# Patient Record
Sex: Female | Born: 2000 | Race: Black or African American | Hispanic: No | Marital: Single | Smoking: Current every day smoker
Health system: Southern US, Community
[De-identification: ages and names within clinical notes are randomized; demographics above are authoritative.]

## PROBLEM LIST (undated history)

## (undated) DIAGNOSIS — T7840XA Allergy, unspecified, initial encounter: Secondary | ICD-10-CM

## (undated) DIAGNOSIS — R51 Headache: Secondary | ICD-10-CM

## (undated) DIAGNOSIS — R519 Headache, unspecified: Secondary | ICD-10-CM

## (undated) DIAGNOSIS — J309 Allergic rhinitis, unspecified: Secondary | ICD-10-CM

## (undated) HISTORY — DX: Allergic rhinitis, unspecified: J30.9

## (undated) HISTORY — DX: Headache: R51

## (undated) HISTORY — DX: Headache, unspecified: R51.9

## (undated) HISTORY — PX: NO PAST SURGERIES: SHX2092

---
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
## 2019-05-11 DIAGNOSIS — Z5181 Encounter for therapeutic drug level monitoring: Secondary | ICD-10-CM | POA: Diagnosis not present

## 2019-05-18 DIAGNOSIS — Z5181 Encounter for therapeutic drug level monitoring: Secondary | ICD-10-CM | POA: Diagnosis not present

## 2019-05-25 DIAGNOSIS — Z5181 Encounter for therapeutic drug level monitoring: Secondary | ICD-10-CM | POA: Diagnosis not present

## 2019-06-01 DIAGNOSIS — Z5181 Encounter for therapeutic drug level monitoring: Secondary | ICD-10-CM | POA: Diagnosis not present

## 2019-06-08 DIAGNOSIS — Z5181 Encounter for therapeutic drug level monitoring: Secondary | ICD-10-CM | POA: Diagnosis not present

## 2019-06-15 DIAGNOSIS — Z5181 Encounter for therapeutic drug level monitoring: Secondary | ICD-10-CM | POA: Diagnosis not present

## 2019-06-23 DIAGNOSIS — Z5181 Encounter for therapeutic drug level monitoring: Secondary | ICD-10-CM | POA: Diagnosis not present

## 2019-07-12 DIAGNOSIS — H5203 Hypermetropia, bilateral: Secondary | ICD-10-CM | POA: Diagnosis not present

## 2019-07-12 DIAGNOSIS — H52203 Unspecified astigmatism, bilateral: Secondary | ICD-10-CM | POA: Diagnosis not present

## 2019-07-13 DIAGNOSIS — Z5181 Encounter for therapeutic drug level monitoring: Secondary | ICD-10-CM | POA: Diagnosis not present

## 2019-07-14 DIAGNOSIS — H5213 Myopia, bilateral: Secondary | ICD-10-CM | POA: Diagnosis not present

## 2019-07-20 DIAGNOSIS — Z5181 Encounter for therapeutic drug level monitoring: Secondary | ICD-10-CM | POA: Diagnosis not present

## 2019-07-28 DIAGNOSIS — R319 Hematuria, unspecified: Secondary | ICD-10-CM | POA: Diagnosis not present

## 2019-07-28 DIAGNOSIS — R140 Abdominal distension (gaseous): Secondary | ICD-10-CM | POA: Diagnosis not present

## 2019-07-28 DIAGNOSIS — R109 Unspecified abdominal pain: Secondary | ICD-10-CM | POA: Diagnosis not present

## 2019-07-28 DIAGNOSIS — Z7689 Persons encountering health services in other specified circumstances: Secondary | ICD-10-CM | POA: Diagnosis not present

## 2019-08-18 DIAGNOSIS — Z5181 Encounter for therapeutic drug level monitoring: Secondary | ICD-10-CM | POA: Diagnosis not present

## 2019-09-01 DIAGNOSIS — Z5181 Encounter for therapeutic drug level monitoring: Secondary | ICD-10-CM | POA: Diagnosis not present

## 2019-09-12 ENCOUNTER — Other Ambulatory Visit: Payer: Self-pay

## 2019-09-12 ENCOUNTER — Emergency Department (HOSPITAL_COMMUNITY)
Admission: EM | Admit: 2019-09-12 | Discharge: 2019-09-12 | Disposition: A | Discharge status: Home / Self Care | Payer: Medicaid Other | Attending: Emergency Medicine | Admitting: Emergency Medicine

## 2019-09-12 ENCOUNTER — Encounter (HOSPITAL_COMMUNITY): Payer: Self-pay | Admitting: *Deleted

## 2019-09-12 DIAGNOSIS — R0981 Nasal congestion: Secondary | ICD-10-CM | POA: Diagnosis not present

## 2019-09-12 DIAGNOSIS — R197 Diarrhea, unspecified: Secondary | ICD-10-CM | POA: Diagnosis not present

## 2019-09-12 DIAGNOSIS — R519 Headache, unspecified: Secondary | ICD-10-CM | POA: Insufficient documentation

## 2019-09-12 DIAGNOSIS — R05 Cough: Secondary | ICD-10-CM | POA: Diagnosis present

## 2019-09-12 DIAGNOSIS — B349 Viral infection, unspecified: Secondary | ICD-10-CM | POA: Insufficient documentation

## 2019-09-12 DIAGNOSIS — Z20822 Contact with and (suspected) exposure to covid-19: Secondary | ICD-10-CM | POA: Diagnosis not present

## 2019-09-12 LAB — SARS CORONAVIRUS 2 (TAT 6-24 HRS): SARS Coronavirus 2: NEGATIVE

## 2019-09-12 MED ORDER — BENZONATATE 100 MG PO CAPS: 100.0000 mg | ORAL_CAPSULE | Freq: Three times a day (TID) | ORAL | 0 refills | Status: DC

## 2019-09-12 MED ORDER — FLUTICASONE PROPIONATE 50 MCG/ACT NA SUSP: 1.0000 | Freq: Every day | NASAL | 0 refills | Status: DC

## 2019-09-12 NOTE — ED Provider Notes (Signed)
[facility]-EMERGENCY DEPT Provider Note   CSN: [id number] Arrival date & time: 09/12/19  1105     History No chief complaint on file.   Patricia [name] is a 19 y.o. female presenting for evaluation of HA, cough, nasal congestion, and diarrhea.   Pt states her sxs began 4-5 days ago. She reports a HA began first, and yesterday she developed significant nasal congestion, cough, and diarrhea.  She states she feels tired.  She reports her cough is nonproductive.  She denies fevers, chills, chest pain, shortness breath, nausea, vomiting, domino pain.  She has no medical problems, takes medications daily.  She does not smoke cigarettes.  No history of asthma or COPD.  She denies sick contacts or known contact with COVID-19 positive person.  She has not received her coronavirus vaccines.  She took NyQuil yesterday, but otherwise has not taken anything for her symptoms including tylenol or ibuprofen.  HPI     Past Medical History:  Diagnosis Date  . Allergic   . Allergic rhinitis   . Generalized headaches     Patient Active Problem List   Diagnosis Date Noted  . Chlamydia infection 12/17/2018  . Bacterial vaginosis 12/17/2018  . Possible exposure to STD 06/03/2017  . Contraception management 04/07/2016  . Episodic tension-type headache, not intractable 02/24/2015  . Migraine without aura and without status migrainosus, not intractable 09/19/2014  . Migraine 09/05/2014  . Depression 10/15/2012  . ACNE VULGARIS 12/07/2009  . Seasonal allergies 11/28/2006    Past Surgical History:  Procedure Laterality Date  . TONSILLECTOMY AND ADENOIDECTOMY  2004     OB History   No obstetric history on file.     Family History  Problem Relation Age of Onset  . Migraines Mother   . Migraines Maternal Grandmother   . Migraines Maternal Aunt     Social History   Tobacco Use  . Smoking status: Passive Smoke Exposure - Never Smoker  . Smokeless tobacco: Never Used  .  Tobacco comment: Mom smokes outside only, pt lives alone currently (09/12/19)  Substance Use Topics  . Alcohol use: No    Alcohol/week: 0.0 standard drinks  . Drug use: No    Home Medications Prior to Admission medications   Medication Sig Start Date End Date Taking? Authorizing Provider  benzonatate (TESSALON) 100 MG capsule Take 1 capsule (100 mg total) by mouth every 8 (eight) hours. 09/12/19   Hadja Harral, PA-C  ciprofloxacin (CIPRO) 250 MG tablet Take 1 tablet (250 mg total) by mouth 2 (two) times daily. 10/30/17   Casey Burkitt, MD  dicyclomine (BENTYL) 20 MG tablet Take 1 tablet (20 mg total) by mouth 2 (two) times daily as needed for spasms. 12/04/18   Merita Hawks, PA-C  EPINEPHrine (EPIPEN 2-PAK) 0.3 mg/0.3 mL IJ SOAJ injection Inject 0.3 mLs (0.3 mg total) into the muscle once as needed. Patient taking differently: Inject 0.3 mg into the muscle once as needed (allergic reaction).  07/08/16   Latrelle Dodrill, MD  fluticasone (FLONASE) 50 MCG/ACT nasal spray Place 1 spray into both nostrils daily. 09/12/19   Tyshea Imel, PA-C  metroNIDAZOLE (FLAGYL) 500 MG tablet Take 1 tablet (500 mg total) by mouth 2 (two) times daily. 12/04/18   Brieana Shimmin, PA-C  ondansetron (ZOFRAN-ODT) 4 MG disintegrating tablet Take 1 tablet (4 mg total) by mouth every 8 (eight) hours as needed for nausea or vomiting. 01/28/18   Mardella Layman, MD    Allergies    Pineapple  and Penicillins  Review of Systems   Review of Systems  HENT: Positive for congestion.   Respiratory: Positive for cough.   Gastrointestinal: Positive for diarrhea.  Neurological: Positive for headaches.  All other systems reviewed and are negative.   Physical Exam Updated Vital Signs BP 113/72 (BP Location: Left Arm)   Pulse 88   Temp 98.9 F (37.2 C) (Oral)   Resp 18   Ht 5\' 4"  (1.626 m)   Wt 54.9 kg   SpO2 100%   BMI 20.77 kg/m   Physical Exam Vitals and nursing note reviewed.    Constitutional:      General: She is not in acute distress.    Appearance: She is well-developed.     Comments: Appears nontoxic  HENT:     Head: Normocephalic and atraumatic.     Comments: Patient likely from edema.  OP clear without tonsillar swelling or exudate.  Uvula midline with good palate rise.  TMs nonerythematous nonbulging bilaterally.    Right Ear: Tympanic membrane, ear canal and external ear normal.     Left Ear: Tympanic membrane, ear canal and external ear normal.     Nose: Mucosal edema present.     Right Sinus: No maxillary sinus tenderness or frontal sinus tenderness.     Left Sinus: No maxillary sinus tenderness or frontal sinus tenderness.     Mouth/Throat:     Pharynx: Uvula midline.     Tonsils: No tonsillar exudate.  Eyes:     Extraocular Movements: Extraocular movements intact.     Conjunctiva/sclera: Conjunctivae normal.     Pupils: Pupils are equal, round, and reactive to light.  Cardiovascular:     Rate and Rhythm: Normal rate and regular rhythm.     Pulses: Normal pulses.  Pulmonary:     Effort: Pulmonary effort is normal.     Breath sounds: Normal breath sounds. No decreased breath sounds, wheezing, rhonchi or rales.     Comments: Speaking in full sentences.  Clear lung sounds in all fields.  Sats stable on room air. Abdominal:     General: There is no distension.     Palpations: Abdomen is soft. There is no mass.     Tenderness: There is no abdominal tenderness. There is no guarding or rebound.     Comments: No tenderness palpation of the abdomen  Musculoskeletal:        General: Normal range of motion.     Cervical back: Normal range of motion.  Lymphadenopathy:     Cervical: No cervical adenopathy.  Skin:    General: Skin is warm.     Capillary Refill: Capillary refill takes less than 2 seconds.  Neurological:     Mental Status: She is alert and oriented to person, place, and time.     ED Results / Procedures / Treatments   Labs (all  labs ordered are listed, but only abnormal results are displayed) Labs Reviewed  SARS CORONAVIRUS 2 (TAT 6-24 HRS)    EKG None  Radiology No results found.  Procedures Procedures (including critical care time)  Medications Ordered in ED Medications - No data to display  ED Course  I have reviewed the triage vital signs and the nursing notes.  Pertinent labs & imaging results that were available during my care of the patient were reviewed by me and considered in my medical decision making (see chart for details).    MDM Rules/Calculators/A&P  Patient presenting with 4-5 days of URI symptoms.  Physical exam reassuring, patient is afebrile and appears nontoxic.  Pulmonary exam reassuring.  Doubt pneumonia, strep, other bacterial infection, or peritonsillar abscess. Likely viral URI, consider covid. Will send out covid testing. discussed quarantine instructions.   Will treat symptomatically.  Patient to follow-up as needed.  At this time, patient appears safe for discharge.  Return precautions given.  Patient states she understands and agrees to plan.   Keirah Konitzer was evaluated in Emergency Department on 09/12/2019 for the symptoms described in the history of present illness. She was evaluated in the context of the global COVID-19 pandemic, which necessitated consideration that the patient might be at risk for infection with the SARS-CoV-2 virus that causes COVID-19. Institutional protocols and algorithms that pertain to the evaluation of patients at risk for COVID-19 are in a state of rapid change based on information released by regulatory bodies including the CDC and federal and state organizations. These policies and algorithms were followed during the patient's care in the ED.  Final Clinical Impression(s) / ED Diagnoses Final diagnoses:  Viral illness    Rx / DC Orders ED Discharge Orders         Ordered    benzonatate (TESSALON) 100 MG capsule  Every 8  hours     09/12/19 1133    fluticasone (FLONASE) 50 MCG/ACT nasal spray  Daily     09/12/19 1133           Molly Maselli, PA-C 09/12/19 1142    Linwood Dibbles, MD 09/12/19 [phone]

## 2019-09-12 NOTE — ED Triage Notes (Signed)
Pt reports a possible exposure from work of COVID-19.  Pt reports symptoms for the past week.  Pt's symptoms included headache, coughing, sneezing, diarrhea. Pt a/o x 4 and ambulatory.

## 2019-09-12 NOTE — Discharge Instructions (Signed)
You likely have a viral illness.  This should be treated symptomatically. Use Tylenol or ibuprofen as needed for headaches, fevers, or body aches. Use Flonase daily for nasal congestion and cough. Use tessalon as needed for cough. Make sure you stay well-hydrated with water. Wash your hands frequently to prevent spread of infection. You were tested for Covid today.  Results should be back within the next 24 hours.  If positive, you will receive a phone call.  If negative, you will not.  Either way, you may check online on MyChart.  If your test is negative, you do not need to quarantine any longer, however if you develop a fever or worsening symptoms, you may need to stay home. If your test is positive, you will need to remain at home for a total of 10 days from symptom onset.  You may end quarantine if you are fever free and your symptoms are not worsening. Follow-up with your primary care doctor in 1 week if your symptoms are not improving. Return to the emergency room if you develop chest pain, difficulty breathing, or any new or worsening symptoms.

## 2019-09-15 DIAGNOSIS — Z5181 Encounter for therapeutic drug level monitoring: Secondary | ICD-10-CM | POA: Diagnosis not present

## 2019-09-19 ENCOUNTER — Encounter (HOSPITAL_COMMUNITY): Payer: Self-pay

## 2019-09-19 ENCOUNTER — Other Ambulatory Visit: Payer: Self-pay

## 2019-09-19 ENCOUNTER — Ambulatory Visit (HOSPITAL_COMMUNITY)
Admission: EM | Admit: 2019-09-19 | Discharge: 2019-09-19 | Disposition: A | Discharge status: Home / Self Care | Payer: Medicaid Other | Attending: Emergency Medicine | Admitting: Emergency Medicine

## 2019-09-19 DIAGNOSIS — N898 Other specified noninflammatory disorders of vagina: Secondary | ICD-10-CM | POA: Insufficient documentation

## 2019-09-19 DIAGNOSIS — N939 Abnormal uterine and vaginal bleeding, unspecified: Secondary | ICD-10-CM | POA: Insufficient documentation

## 2019-09-19 DIAGNOSIS — R1030 Lower abdominal pain, unspecified: Secondary | ICD-10-CM | POA: Diagnosis not present

## 2019-09-19 DIAGNOSIS — Z113 Encounter for screening for infections with a predominantly sexual mode of transmission: Secondary | ICD-10-CM | POA: Insufficient documentation

## 2019-09-19 LAB — POCT URINALYSIS DIP (DEVICE)
Bilirubin Urine: NEGATIVE
Glucose, UA: NEGATIVE mg/dL
Hgb urine dipstick: NEGATIVE
Ketones, ur: NEGATIVE mg/dL
Nitrite: NEGATIVE
Protein, ur: NEGATIVE mg/dL
Specific Gravity, Urine: >=1.03 (ref 1.005–1.030)
Urobilinogen, UA: 0.2 mg/dL (ref 0.0–1.0)
pH: 7.5 (ref 5.0–8.0)

## 2019-09-19 LAB — POC URINE PREG, ED: Preg Test, Ur: NEGATIVE

## 2019-09-19 LAB — HIV ANTIBODY (ROUTINE TESTING W REFLEX): HIV Screen 4th Generation wRfx: NONREACTIVE

## 2019-09-19 MED ORDER — CEFTRIAXONE SODIUM 500 MG IJ SOLR
500.0000 mg | Freq: Once | INTRAMUSCULAR | Status: AC
  Administered 2019-09-19: 500 mg via INTRAMUSCULAR

## 2019-09-19 MED ORDER — CEFTRIAXONE SODIUM 500 MG IJ SOLR
INTRAMUSCULAR | Status: AC
  Filled 2019-09-19: qty 500

## 2019-09-19 MED ORDER — DOXYCYCLINE HYCLATE 100 MG PO CAPS: 100.0000 mg | ORAL_CAPSULE | Freq: Two times a day (BID) | ORAL | 0 refills | Status: AC

## 2019-09-19 MED ORDER — LIDOCAINE HCL (PF) 1 % IJ SOLN
INTRAMUSCULAR | Status: AC
  Filled 2019-09-19: qty 2

## 2019-09-19 NOTE — ED Triage Notes (Addendum)
Pt present vaginal bleeding with abdominal pain.  Symptoms started a week ago, she has been spotting and passing clots of blood.

## 2019-09-19 NOTE — Discharge Instructions (Signed)
We have treated you today for gonorrhea, with rocephin.  Please also begin taking doxycycline twice daily for the next 7 days to cover for chlamydia.  Please refrain from sexual intercourse for 7 days while medicines eliminating infection.   May use Tylenol/ibuprofen for abdominal pain as needed  We are testing you for HIV, syphilis, gonorrhea, Chlamydia, Trichomonas, Yeast and Bacterial Vaginosis. We will call you if anything is positive and let you know if you require any further treatment. Please inform partners of any positive results.   Please return if symptoms not improving with treatment, development of fever, nausea, vomiting, abdominal pain.

## 2019-09-19 NOTE — ED Provider Notes (Signed)
[facility]    CSN: [id number] Arrival date & time: 09/19/19  1135      History   Chief Complaint Chief Complaint  Patient presents with  . Vaginal Bleeding  . Abdominal Pain    HPI Patricia [name] is a 19 y.o. female presenting today for evaluation of abnormal bleeding.  Patient reports over the past week she has noticed some irregular bleeding occasionally with urination and with wiping.  She initially thought her cycle was beginning, but then bleeding has stopped.  It has been intermittent.  She has had associated abdominal pain in her lower abdomen.  Also reports abnormal discharge which she describes as creamy and thicker.  This is been going on for approximately 1.5 weeks.  Last menstrual cycle was end of April.  She denies any nausea or vomiting.  Denies change in appetite.  Eating and drinking without affecting abdominal pain.  HPI  Past Medical History:  Diagnosis Date  . Allergic   . Allergic rhinitis   . Generalized headaches     Patient Active Problem List   Diagnosis Date Noted  . Chlamydia infection 12/17/2018  . Bacterial vaginosis 12/17/2018  . Possible exposure to STD 06/03/2017  . Contraception management 04/07/2016  . Episodic tension-type headache, not intractable 02/24/2015  . Migraine without aura and without status migrainosus, not intractable 09/19/2014  . Migraine 09/05/2014  . Depression 10/15/2012  . ACNE VULGARIS 12/07/2009  . Seasonal allergies 11/28/2006    Past Surgical History:  Procedure Laterality Date  . TONSILLECTOMY AND ADENOIDECTOMY  2004    OB History   No obstetric history on file.      Home Medications    Prior to Admission medications   Medication Sig Start Date End Date Taking? Authorizing Provider  benzonatate (TESSALON) 100 MG capsule Take 1 capsule (100 mg total) by mouth every 8 (eight) hours. 09/12/19   Caccavale, Sophia, PA-C  ciprofloxacin (CIPRO) 250 MG tablet Take 1 tablet (250 mg total) by mouth 2  (two) times daily. 10/30/17   Casey Burkitt, MD  dicyclomine (BENTYL) 20 MG tablet Take 1 tablet (20 mg total) by mouth 2 (two) times daily as needed for spasms. 12/04/18   Caccavale, Sophia, PA-C  doxycycline (VIBRAMYCIN) 100 MG capsule Take 1 capsule (100 mg total) by mouth 2 (two) times daily for 7 days. 09/19/19 09/26/19  Wieters, Hallie C, PA-C  EPINEPHrine (EPIPEN 2-PAK) 0.3 mg/0.3 mL IJ SOAJ injection Inject 0.3 mLs (0.3 mg total) into the muscle once as needed. Patient taking differently: Inject 0.3 mg into the muscle once as needed (allergic reaction).  07/08/16   Latrelle Dodrill, MD  fluticasone (FLONASE) 50 MCG/ACT nasal spray Place 1 spray into both nostrils daily. 09/12/19   Caccavale, Sophia, PA-C  metroNIDAZOLE (FLAGYL) 500 MG tablet Take 1 tablet (500 mg total) by mouth 2 (two) times daily. 12/04/18   Caccavale, Sophia, PA-C  ondansetron (ZOFRAN-ODT) 4 MG disintegrating tablet Take 1 tablet (4 mg total) by mouth every 8 (eight) hours as needed for nausea or vomiting. 01/28/18   Mardella Layman, MD    Family History Family History  Problem Relation Age of Onset  . Migraines Mother   . Migraines Maternal Grandmother   . Migraines Maternal Aunt     Social History Social History   Tobacco Use  . Smoking status: Passive Smoke Exposure - Never Smoker  . Smokeless tobacco: Never Used  . Tobacco comment: Mom smokes outside only, pt lives alone currently (09/12/19)  Substance Use Topics  . Alcohol use: No    Alcohol/week: 0.0 standard drinks  . Drug use: No     Allergies   Pineapple and Penicillins   Review of Systems Review of Systems  Constitutional: Negative for fever.  Respiratory: Negative for shortness of breath.   Cardiovascular: Negative for chest pain.  Gastrointestinal: Positive for abdominal pain. Negative for diarrhea, nausea and vomiting.  Genitourinary: Positive for frequency, vaginal bleeding and vaginal discharge. Negative for dysuria, flank pain,  genital sores, hematuria, menstrual problem and vaginal pain.  Musculoskeletal: Negative for back pain.  Skin: Negative for rash.  Neurological: Negative for dizziness, light-headedness and headaches.     Physical Exam Triage Vital Signs ED Triage Vitals [09/19/19 1202]  Enc Vitals Group     BP 98/66     Pulse Rate 75     Resp 16     Temp 98.6 F (37 C)     Temp Source Oral     SpO2 100 %     Weight      Height      Head Circumference      Peak Flow      Pain Score 7     Pain Loc      Pain Edu?      Excl. in GC?    No data found.  Updated Vital Signs BP 98/66 (BP Location: Left Arm)   Pulse 75   Temp 98.6 F (37 C) (Oral)   Resp 16   LMP 08/29/2019   SpO2 100%   Visual Acuity Right Eye Distance:   Left Eye Distance:   Bilateral Distance:    Right Eye Near:   Left Eye Near:    Bilateral Near:     Physical Exam Vitals and nursing note reviewed.  Constitutional:      Appearance: She is well-developed.     Comments: No acute distress  HENT:     Head: Normocephalic and atraumatic.     Nose: Nose normal.  Eyes:     Conjunctiva/sclera: Conjunctivae normal.  Cardiovascular:     Rate and Rhythm: Normal rate.  Pulmonary:     Effort: Pulmonary effort is normal. No respiratory distress.  Abdominal:     General: There is no distension.     Tenderness: There is abdominal tenderness.     Comments: Soft, nondistended, tender to palpation to bilateral lower quadrants and suprapubic area  Genitourinary:    Comments: Normal external female genitalia, no rashes or lesions noted, no source of bleeding identified externally, vaginal mucosa slightly erythematous, cervix appears erythematous and is friable to light touch with swab, moderate amount of yellowish watery discharge present Musculoskeletal:        General: Normal range of motion.     Cervical back: Neck supple.  Skin:    General: Skin is warm and dry.  Neurological:     Mental Status: She is alert and  oriented to person, place, and time.      UC Treatments / Results  Labs (all labs ordered are listed, but only abnormal results are displayed) Labs Reviewed  POCT URINALYSIS DIP (DEVICE) - Abnormal; Notable for the following components:      Result Value   Leukocytes,Ua SMALL (*)    All other components within normal limits  URINE CULTURE  HIV ANTIBODY (ROUTINE TESTING W REFLEX)  RPR  POC URINE PREG, ED  CERVICOVAGINAL ANCILLARY ONLY    EKG   Radiology No results found.  Procedures  Procedures (including critical care time)  Medications Ordered in UC Medications  cefTRIAXone (ROCEPHIN) injection 500 mg (500 mg Intramuscular Given 09/19/19 1249)    Initial Impression / Assessment and Plan / UC Course  I have reviewed the triage vital signs and the nursing notes.  Pertinent labs & imaging results that were available during my care of the patient were reviewed by me and considered in my medical decision making (see chart for details).     Pregnancy test negative, UA with negative hemoglobin, does have small leuks, suspect most likely from discharge.  Based off exam and friable cervix suspicious of likely vaginal infection contributing to symptoms.  Empirically treating for gonorrhea and chlamydia today, swab pending, will call with results and add medicines as needed.  Discussed strict return precautions. Patient verbalized understanding and is agreeable with plan.  Final Clinical Impressions(s) / UC Diagnoses   Final diagnoses:  Lower abdominal pain  Vaginal bleeding  Vaginal discharge  Screen for STD (sexually transmitted disease)     Discharge Instructions     We have treated you today for gonorrhea, with rocephin.  Please also begin taking doxycycline twice daily for the next 7 days to cover for chlamydia.  Please refrain from sexual intercourse for 7 days while medicines eliminating infection.   May use Tylenol/ibuprofen for abdominal pain as needed  We  are testing you for HIV, syphilis, gonorrhea, Chlamydia, Trichomonas, Yeast and Bacterial Vaginosis. We will call you if anything is positive and let you know if you require any further treatment. Please inform partners of any positive results.   Please return if symptoms not improving with treatment, development of fever, nausea, vomiting, abdominal pain.      ED Prescriptions    Medication Sig Dispense Auth. Provider   doxycycline (VIBRAMYCIN) 100 MG capsule Take 1 capsule (100 mg total) by mouth 2 (two) times daily for 7 days. 14 capsule Wieters, [place] C, PA-C     PDMP not reviewed this encounter.   Janith Lima, [place] 09/19/19 1258

## 2019-09-20 ENCOUNTER — Telehealth (HOSPITAL_COMMUNITY): Payer: Self-pay

## 2019-09-20 LAB — CERVICOVAGINAL ANCILLARY ONLY
Bacterial Vaginitis (gardnerella): POSITIVE — AB
Candida Glabrata: NEGATIVE
Candida Vaginitis: NEGATIVE
Chlamydia: NEGATIVE
Comment: NEGATIVE
Comment: NEGATIVE
Comment: NEGATIVE
Comment: NEGATIVE
Comment: NEGATIVE
Comment: NORMAL
Neisseria Gonorrhea: NEGATIVE
Trichomonas: POSITIVE — AB

## 2019-09-20 LAB — URINE CULTURE

## 2019-09-20 LAB — RPR: RPR Ser Ql: NONREACTIVE

## 2019-09-20 MED ORDER — METRONIDAZOLE 500 MG PO TABS: 500.0000 mg | ORAL_TABLET | Freq: Two times a day (BID) | ORAL | 0 refills | Status: DC

## 2019-09-22 DIAGNOSIS — Z5181 Encounter for therapeutic drug level monitoring: Secondary | ICD-10-CM | POA: Diagnosis not present

## 2019-09-26 ENCOUNTER — Emergency Department (HOSPITAL_COMMUNITY): Payer: Medicaid Other

## 2019-09-26 ENCOUNTER — Other Ambulatory Visit: Payer: Self-pay

## 2019-09-26 ENCOUNTER — Emergency Department (HOSPITAL_COMMUNITY)
Admission: EM | Admit: 2019-09-26 | Discharge: 2019-09-26 | Disposition: A | Discharge status: Home / Self Care | Payer: Medicaid Other | Attending: Emergency Medicine | Admitting: Emergency Medicine

## 2019-09-26 DIAGNOSIS — S161XXA Strain of muscle, fascia and tendon at neck level, initial encounter: Secondary | ICD-10-CM | POA: Insufficient documentation

## 2019-09-26 DIAGNOSIS — S0990XA Unspecified injury of head, initial encounter: Secondary | ICD-10-CM | POA: Diagnosis not present

## 2019-09-26 DIAGNOSIS — S098XXA Other specified injuries of head, initial encounter: Secondary | ICD-10-CM | POA: Insufficient documentation

## 2019-09-26 DIAGNOSIS — Y929 Unspecified place or not applicable: Secondary | ICD-10-CM | POA: Insufficient documentation

## 2019-09-26 DIAGNOSIS — Z7722 Contact with and (suspected) exposure to environmental tobacco smoke (acute) (chronic): Secondary | ICD-10-CM | POA: Insufficient documentation

## 2019-09-26 DIAGNOSIS — Y939 Activity, unspecified: Secondary | ICD-10-CM | POA: Diagnosis not present

## 2019-09-26 DIAGNOSIS — Y999 Unspecified external cause status: Secondary | ICD-10-CM | POA: Diagnosis not present

## 2019-09-26 MED ORDER — ACETAMINOPHEN 325 MG PO TABS
650.0000 mg | ORAL_TABLET | Freq: Once | ORAL | Status: AC
  Administered 2019-09-26: 650 mg via ORAL
  Filled 2019-09-26: qty 2
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
## 2019-09-26 NOTE — ED Notes (Signed)
Pt transferred to CT.
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
## 2019-09-26 NOTE — Discharge Instructions (Addendum)
Follow up with your primary care provider and use the instructions regarding head injury precautions. Continue taking Tylenol as needed to help with your headache and neck pain.  Return to the ED if you start to experience worsening headache, blurry vision, additional injuries, trouble walking.

## 2019-09-29 DIAGNOSIS — Z5181 Encounter for therapeutic drug level monitoring: Secondary | ICD-10-CM | POA: Diagnosis not present

## 2019-10-06 DIAGNOSIS — Z5181 Encounter for therapeutic drug level monitoring: Secondary | ICD-10-CM | POA: Diagnosis not present

## 2019-10-12 DIAGNOSIS — Z5181 Encounter for therapeutic drug level monitoring: Secondary | ICD-10-CM | POA: Diagnosis not present

## 2019-10-16 ENCOUNTER — Ambulatory Visit (HOSPITAL_COMMUNITY)
Admission: EM | Admit: 2019-10-16 | Discharge: 2019-10-16 | Disposition: A | Discharge status: Home / Self Care | Payer: Medicaid Other | Attending: Emergency Medicine | Admitting: Emergency Medicine

## 2019-10-16 ENCOUNTER — Other Ambulatory Visit: Payer: Self-pay

## 2019-10-16 ENCOUNTER — Encounter (HOSPITAL_COMMUNITY): Payer: Self-pay

## 2019-10-16 DIAGNOSIS — Z20822 Contact with and (suspected) exposure to covid-19: Secondary | ICD-10-CM | POA: Diagnosis not present

## 2019-10-16 DIAGNOSIS — R519 Headache, unspecified: Secondary | ICD-10-CM | POA: Insufficient documentation

## 2019-10-16 DIAGNOSIS — A599 Trichomoniasis, unspecified: Secondary | ICD-10-CM | POA: Insufficient documentation

## 2019-10-16 DIAGNOSIS — Z7722 Contact with and (suspected) exposure to environmental tobacco smoke (acute) (chronic): Secondary | ICD-10-CM | POA: Diagnosis not present

## 2019-10-16 DIAGNOSIS — R197 Diarrhea, unspecified: Secondary | ICD-10-CM | POA: Insufficient documentation

## 2019-10-16 DIAGNOSIS — Z79899 Other long term (current) drug therapy: Secondary | ICD-10-CM | POA: Diagnosis not present

## 2019-10-16 DIAGNOSIS — R0981 Nasal congestion: Secondary | ICD-10-CM | POA: Insufficient documentation

## 2019-10-16 LAB — SARS CORONAVIRUS 2 (TAT 6-24 HRS): SARS Coronavirus 2: NEGATIVE

## 2019-10-16 MED ORDER — ONDANSETRON 4 MG PO TBDP
ORAL_TABLET | ORAL | Status: AC
  Filled 2019-10-16: qty 1

## 2019-10-16 MED ORDER — METRONIDAZOLE 0.75 % VA GEL: 1.0000 | Freq: Every day | VAGINAL | 0 refills | Status: AC

## 2019-10-16 MED ORDER — ONDANSETRON 4 MG PO TBDP: 4.0000 mg | ORAL_TABLET | Freq: Three times a day (TID) | ORAL | 0 refills | Status: DC | PRN

## 2019-10-16 MED ORDER — METRONIDAZOLE 500 MG PO TABS
2000.0000 mg | ORAL_TABLET | Freq: Once | ORAL | Status: AC
  Administered 2019-10-16: 2000 mg via ORAL

## 2019-10-16 MED ORDER — METRONIDAZOLE 500 MG PO TABS
ORAL_TABLET | ORAL | Status: AC
  Filled 2019-10-16: qty 4

## 2019-10-16 MED ORDER — ONDANSETRON 4 MG PO TBDP
4.0000 mg | ORAL_TABLET | Freq: Once | ORAL | Status: AC
  Administered 2019-10-16: 4 mg via ORAL

## 2019-10-16 MED ORDER — FLUTICASONE PROPIONATE 50 MCG/ACT NA SUSP: 1.0000 | Freq: Every day | NASAL | 0 refills | Status: DC

## 2019-10-16 NOTE — ED Triage Notes (Addendum)
C/o diarrhea, headache, and runny nose. Requesting testing for COVID.  Also requesting the a different medication be prescribed from 09-19-19 visit where she tested positive for Trichomonas. States she tried taking abx but they caused her to have nausea and abdominal pain. She states she felt like she was going to pass out.   Patient requesting a work note

## 2019-10-16 NOTE — Discharge Instructions (Signed)
COVID test pending, monitor my chart for results May use Flonase nasal spray for nasal congestion or over-the-counter Mucinex, Sudafed, Zyrtec-D May use some Imodium or Pepto-Bismol to help slow bowel movements, be sure to drink plenty of fluids with diarrhea Zofran as needed for nausea  We gave you one-time dose of Flagyl today to treat for trichomoniasis, continue with MetroGel vaginal cream at bedtime x5 days to treat bacterial vaginosis  Please return if any symptoms not improving worsening or changing

## 2019-10-17 NOTE — ED Provider Notes (Signed)
[facility]    CSN: [id number] Arrival date & time: 10/16/19  1443      History   Chief Complaint Chief Complaint  Patient presents with  . Diarrhea  . Headache  . Nasal Congestion  . Medication Problem    HPI Patricia [name] is a 19 y.o. female presenting today for evaluation of congestion and diarrhea.  Patient reports over the past 2 days she has had diarrhea along with some nasal congestion.  She denies any cough, chest pain or shortness of breath.  Denies fevers.  She has had some headache as well as dizziness.  Some of her symptoms she has been attributing to use of metronidazole which she was on for treatment for trichomonas/BV.  She reports poor tolerance of Flagyl and is requesting alternative medicine. Denies COVID exposure.   HPI  Past Medical History:  Diagnosis Date  . Allergic   . Allergic rhinitis   . Generalized headaches     Patient Active Problem List   Diagnosis Date Noted  . Chlamydia infection 12/17/2018  . Bacterial vaginosis 12/17/2018  . Possible exposure to STD 06/03/2017  . Contraception management 04/07/2016  . Episodic tension-type headache, not intractable 02/24/2015  . Migraine without aura and without status migrainosus, not intractable 09/19/2014  . Migraine 09/05/2014  . Depression 10/15/2012  . ACNE VULGARIS 12/07/2009  . Seasonal allergies 11/28/2006    Past Surgical History:  Procedure Laterality Date  . TONSILLECTOMY AND ADENOIDECTOMY  2004    OB History   No obstetric history on file.      Home Medications    Prior to Admission medications   Medication Sig Start Date End Date Taking? Authorizing Provider  dicyclomine (BENTYL) 20 MG tablet Take 1 tablet (20 mg total) by mouth 2 (two) times daily as needed for spasms. Patient taking differently: Take 20 mg by mouth 3 (three) times daily as needed for spasms.  12/04/18   Caccavale, Sophia, PA-C  EPINEPHrine (EPIPEN 2-PAK) 0.3 mg/0.3 mL IJ SOAJ injection Inject 0.3  mLs (0.3 mg total) into the muscle once as needed. Patient taking differently: Inject 0.3 mg into the muscle once as needed (allergic reaction).  07/08/16   Latrelle Dodrill, MD  fluticasone (FLONASE) 50 MCG/ACT nasal spray Place 1-2 sprays into both nostrils daily for 7 days. 10/16/19 10/23/19  Osmel Dykstra C, PA-C  metroNIDAZOLE (METROGEL VAGINAL) 0.75 % vaginal gel Place 1 Applicatorful vaginally at bedtime for 5 days. 10/16/19 10/21/19  Korene Dula C, PA-C  ondansetron (ZOFRAN ODT) 4 MG disintegrating tablet Take 1 tablet (4 mg total) by mouth every 8 (eight) hours as needed for nausea or vomiting. 10/16/19   Khalia Gong, Junius Creamer, PA-C    Family History Family History  Problem Relation Age of Onset  . Migraines Mother   . Migraines Maternal Grandmother   . Migraines Maternal Aunt     Social History Social History   Tobacco Use  . Smoking status: Passive Smoke Exposure - Never Smoker  . Smokeless tobacco: Never Used  . Tobacco comment: Mom smokes outside only, pt lives alone currently (09/12/19)  Substance Use Topics  . Alcohol use: No    Alcohol/week: 0.0 standard drinks  . Drug use: No     Allergies   Pineapple and Penicillins   Review of Systems Review of Systems  Constitutional: Positive for fatigue. Negative for activity change, appetite change, chills and fever.  HENT: Positive for congestion and rhinorrhea. Negative for ear pain, sinus pressure, sore throat  and trouble swallowing.   Eyes: Negative for discharge and redness.  Respiratory: Negative for cough, chest tightness and shortness of breath.   Cardiovascular: Negative for chest pain.  Gastrointestinal: Positive for diarrhea. Negative for abdominal pain, nausea and vomiting.  Musculoskeletal: Negative for myalgias.  Skin: Negative for rash.  Neurological: Positive for headaches. Negative for dizziness and light-headedness.     Physical Exam Triage Vital Signs ED Triage Vitals  Enc Vitals Group     BP  10/16/19 1536 (!) 110/46     Pulse Rate 10/16/19 1536 76     Resp 10/16/19 1536 17     Temp 10/16/19 1536 98.6 F (37 C)     Temp Source 10/16/19 1536 Oral     SpO2 10/16/19 1536 100 %     Weight --      Height --      Head Circumference --      Peak Flow --      Pain Score 10/16/19 1532 0     Pain Loc --      Pain Edu? --      Excl. in [place]? --    No data found.  Updated Vital Signs BP (!) 110/46 (BP Location: Right Arm)   Pulse 76   Temp 98.6 F (37 C) (Oral)   Resp 17   LMP 10/16/2019   SpO2 100%   Visual Acuity Right Eye Distance:   Left Eye Distance:   Bilateral Distance:    Right Eye Near:   Left Eye Near:    Bilateral Near:     Physical Exam Vitals and nursing note reviewed.  Constitutional:      Appearance: She is well-developed.     Comments: No acute distress  HENT:     Head: Normocephalic and atraumatic.     Ears:     Comments: Bilateral ears without tenderness to palpation of external auricle, tragus and mastoid, EAC's without erythema or swelling, TM's with good bony landmarks and cone of light. Non erythematous.     Nose: Nose normal.     Mouth/Throat:     Comments: Oral mucosa pink and moist, no tonsillar enlargement or exudate. Posterior pharynx patent and nonerythematous, no uvula deviation or swelling. Normal phonation. Eyes:     Conjunctiva/sclera: Conjunctivae normal.  Cardiovascular:     Rate and Rhythm: Normal rate.  Pulmonary:     Effort: Pulmonary effort is normal. No respiratory distress.     Comments: Breathing comfortably at rest, CTABL, no wheezing, rales or other adventitious sounds auscultated Abdominal:     General: There is no distension.  Musculoskeletal:        General: Normal range of motion.     Cervical back: Neck supple.  Skin:    General: Skin is warm and dry.  Neurological:     Mental Status: She is alert and oriented to person, place, and time.      UC Treatments / Results  Labs (all labs ordered are listed,  but only abnormal results are displayed) Labs Reviewed  SARS CORONAVIRUS 2 (TAT 6-24 HRS)    EKG   Radiology No results found.  Procedures Procedures (including critical care time)  Medications Ordered in UC Medications  ondansetron (ZOFRAN-ODT) disintegrating tablet 4 mg (4 mg Oral Given 10/16/19 1626)  metroNIDAZOLE (FLAGYL) tablet 2,000 mg (2,000 mg Oral Given 10/16/19 1626)    Initial Impression / Assessment and Plan / UC Course  I have reviewed the triage vital signs and the  nursing notes.  Pertinent labs & imaging results that were available during my care of the patient were reviewed by me and considered in my medical decision making (see chart for details).     1.  Nasal congestion/diarrhea, likely viral etiology and recommending symptomatic and supportive care.  Also possible side effects from antibiotic use.  Covid PCR pending.  2.  Trichomonas-provided alternative 2 g single dose for trichomonas, switching to MetroGel for BV treatment.  Discussed strict return precautions. Patient verbalized understanding and is agreeable with plan.  Final Clinical Impressions(s) / UC Diagnoses   Final diagnoses:  Nasal congestion  Diarrhea, unspecified type  Trichimoniasis     Discharge Instructions     COVID test pending, monitor my chart for results May use Flonase nasal spray for nasal congestion or over-the-counter Mucinex, Sudafed, Zyrtec-D May use some Imodium or Pepto-Bismol to help slow bowel movements, be sure to drink plenty of fluids with diarrhea Zofran as needed for nausea  We gave you one-time dose of Flagyl today to treat for trichomoniasis, continue with MetroGel vaginal cream at bedtime x5 days to treat bacterial vaginosis  Please return if any symptoms not improving worsening or changing   ED Prescriptions    Medication Sig Dispense Auth. Provider   metroNIDAZOLE (METROGEL VAGINAL) 0.75 % vaginal gel Place 1 Applicatorful vaginally at bedtime for 5  days. 70 g Estel Tonelli C, PA-C   fluticasone (FLONASE) 50 MCG/ACT nasal spray Place 1-2 sprays into both nostrils daily for 7 days. 1 g Kelden Lavallee C, PA-C   ondansetron (ZOFRAN ODT) 4 MG disintegrating tablet Take 1 tablet (4 mg total) by mouth every 8 (eight) hours as needed for nausea or vomiting. 20 tablet Christine Schiefelbein, [place] C, PA-C     PDMP not reviewed this encounter.   Hubert Derstine, [place] C, PA-C 10/17/19 1018

## 2019-10-19 DIAGNOSIS — Z5181 Encounter for therapeutic drug level monitoring: Secondary | ICD-10-CM | POA: Diagnosis not present

## 2019-10-26 DIAGNOSIS — Z5181 Encounter for therapeutic drug level monitoring: Secondary | ICD-10-CM | POA: Diagnosis not present

## 2019-12-07 ENCOUNTER — Other Ambulatory Visit: Payer: Self-pay

## 2019-12-07 ENCOUNTER — Ambulatory Visit (INDEPENDENT_AMBULATORY_CARE_PROVIDER_SITE_OTHER): Payer: Medicaid Other | Admitting: Family Medicine

## 2019-12-07 VITALS — BP 100/70 | HR 98 | Temp 98.8°F

## 2019-12-07 DIAGNOSIS — Z20822 Contact with and (suspected) exposure to covid-19: Secondary | ICD-10-CM

## 2019-12-07 NOTE — Progress Notes (Signed)
    SUBJECTIVE:   HPI:   Upper Respiratory Tract Infection-Patient reports recent onset of cough, diarrhea, congestion, sore throat, headache; lightheaddedness and dizziness. She has been in contact with symptomatic family members (sister; and nieces / nephews) who tested positive for COVID; and a Production designer, theatre/television/film who had the flu. She is not vaccinated. 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 Unclear if febrile at the time, as patient reports temperature as under 100, though she is not sure of exact. She is afebrile at this time.     Deferred Pelvic Exam: Shared decision making was used during the course of this visit. Patient with hx of BV, endorsing similar symptomology at this time. Agreeable to return for pelvic exam at a later date.   PERTINENT  PMH / PSH: Depression, Bacterial Vaginosis, STI, migraines  OBJECTIVE:   BP 100/70   Pulse 98   Temp 98.8 F (37.1 C) (Axillary)   SpO2 98%   General: No acute distress. Alert and responsive. Appears mildly tired HEENT: Normocephalic, atraumatic CV: Regular rate and rhythm, no murmur Pulmonary: Normal work of breathing. Clear to auscultation bilaterally  ASSESSMENT/PLAN:   Upper Respiratory Tract Infection There is high probability that patient has COVID given recent exposures, vaccination status (not vaccinated), and symptoms. Patient provided with list of some places where she can go for testing; as well as quarantine instructions.   Deferred Pelvic Exam  Shared decision making was used during the course of this visit. Patient to return for pelvic exam at a later date to address BV concerns.    No problem-specific Assessment & Plan notes found for this encounter.     Greer Ee, Medical Student [facility]

## 2019-12-07 NOTE — Patient Instructions (Signed)
There is a high probability that you have COVID please go get tested at any of these places and let us know your results:  Testing Locations [facility] [employer] [address] [place], [place] [address] Hours: Tues 9a - 5p  [facility] [address] [place], [place] [address] Hours: Mon 9a - 12p  [place] [facility] [address] [place], [place] [address] Hours: Magdalene Molly 9a -12p  [facility] [address] [place], [place] [address] Hours: Thurs 2p - 5p  Rachel Bo if you have been in close contact (within 6 feet of someone for a cumulative total of 15 minutes or more over a 24-hour period) with someone who has COVID-19, unless you have been fully vaccinated. People who are fully vaccinated do NOT need to quarantine after contact with someone who had COVID-19 unless they have symptoms. However, fully vaccinated people should get tested 3-5 days after their exposure, even they don't have symptoms and wear a mask indoors in public for 14 days following exposure or until their test result is negative.

## 2019-12-09 ENCOUNTER — Ambulatory Visit (HOSPITAL_COMMUNITY)
Admission: EM | Admit: 2019-12-09 | Discharge: 2019-12-09 | Disposition: A | Discharge status: Home / Self Care | Payer: Medicaid Other | Attending: Family Medicine | Admitting: Family Medicine

## 2019-12-09 ENCOUNTER — Other Ambulatory Visit: Payer: Self-pay

## 2019-12-09 ENCOUNTER — Encounter (HOSPITAL_COMMUNITY): Payer: Self-pay

## 2019-12-09 DIAGNOSIS — R519 Headache, unspecified: Secondary | ICD-10-CM | POA: Insufficient documentation

## 2019-12-09 DIAGNOSIS — Z1152 Encounter for screening for COVID-19: Secondary | ICD-10-CM | POA: Insufficient documentation

## 2019-12-09 DIAGNOSIS — R059 Cough, unspecified: Secondary | ICD-10-CM

## 2019-12-09 DIAGNOSIS — R0981 Nasal congestion: Secondary | ICD-10-CM | POA: Insufficient documentation

## 2019-12-09 DIAGNOSIS — R05 Cough: Secondary | ICD-10-CM | POA: Insufficient documentation

## 2019-12-09 DIAGNOSIS — R197 Diarrhea, unspecified: Secondary | ICD-10-CM | POA: Diagnosis not present

## 2019-12-09 DIAGNOSIS — Z20822 Contact with and (suspected) exposure to covid-19: Secondary | ICD-10-CM | POA: Diagnosis not present

## 2019-12-09 DIAGNOSIS — J029 Acute pharyngitis, unspecified: Secondary | ICD-10-CM | POA: Diagnosis not present

## 2019-12-09 DIAGNOSIS — B349 Viral infection, unspecified: Secondary | ICD-10-CM | POA: Diagnosis not present

## 2019-12-09 LAB — SARS CORONAVIRUS 2 (TAT 6-24 HRS): SARS Coronavirus 2: NEGATIVE

## 2019-12-09 NOTE — Discharge Instructions (Signed)
Your COVID test is pending.  You should self quarantine until the test result is back.    Take Tylenol as needed for fever or discomfort.  Rest and keep yourself hydrated.    Go to the emergency department if you develop acute worsening symptoms.     
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
## 2019-12-09 NOTE — ED Provider Notes (Signed)
[facility]   [id number] 12/09/19 Arrival Time: 5852   CC: COVID symptoms  SUBJECTIVE: History from: patient.  Dashanique Brownstein is a 19 y.o. female who presents with abrupt onset of nasal congestion, PND, sore throat, headache, diarrhea and persistent dry cough for 5 days. Denies sick exposure to COVID, flu or strep. Denies recent travel. Has negative history of Covid. Has not completed Covid vaccines. Has not taken OTC medications for this. There are no aggravating or alleviating factors. Denies previous symptoms in the past. Denies fever, chills, fatigue, sinus pain, rhinorrhea, SOB, wheezing, chest pain, nausea, changes in bladder habits.    ROS: As per HPI.  All other pertinent ROS negative.     Past Medical History:  Diagnosis Date  . Allergic   . Allergic rhinitis   . Generalized headaches    Past Surgical History:  Procedure Laterality Date  . TONSILLECTOMY AND ADENOIDECTOMY  2004    No current facility-administered medications on file prior to encounter.   Current Outpatient Medications on File Prior to Encounter  Medication Sig Dispense Refill  . dicyclomine (BENTYL) 20 MG tablet Take 1 tablet (20 mg total) by mouth 2 (two) times daily as needed for spasms. (Patient taking differently: Take 20 mg by mouth 3 (three) times daily as needed for spasms. ) 20 tablet 0  . EPINEPHrine (EPIPEN 2-PAK) 0.3 mg/0.3 mL IJ SOAJ injection Inject 0.3 mLs (0.3 mg total) into the muscle once as needed. (Patient taking differently: Inject 0.3 mg into the muscle once as needed (allergic reaction). ) 2 Device 0  . fluticasone (FLONASE) 50 MCG/ACT nasal spray Place 1-2 sprays into both nostrils daily for 7 days. 1 g 0  . ondansetron (ZOFRAN ODT) 4 MG disintegrating tablet Take 1 tablet (4 mg total) by mouth every 8 (eight) hours as needed for nausea or vomiting. 20 tablet 0  . SODIUM FLUORIDE 5000 PPM 1.1 % PSTE Take by mouth.     Social History   Socioeconomic History  . Marital  status: Single    Spouse name: Not on file  . Number of children: Not on file  . Years of education: Not on file  . Highest education level: Not on file  Occupational History  . Not on file  Tobacco Use  . Smoking status: Passive Smoke Exposure - Never Smoker  . Smokeless tobacco: Never Used  . Tobacco comment: Mom smokes outside only, pt lives alone currently (09/12/19)  Substance and Sexual Activity  . Alcohol use: No    Alcohol/week: 0.0 standard drinks  . Drug use: No  . Sexual activity: Never  Other Topics Concern  . Not on file  Social History Narrative   Shannan is a 11th grade student.   She attends [employer].    She lives with her mother and her sister.    She enjoys going out to the movies and being on her phone.    Social Determinants of Health   Financial Resource Strain:   . Difficulty of Paying Living Expenses:   Food Insecurity:   . Worried About Programme researcher, broadcasting/film/video in the Last Year:   . Barista in the Last Year:   Transportation Needs:   . Freight forwarder (Medical):   Marland Kitchen Lack of Transportation (Non-Medical):   Physical Activity:   . Days of Exercise per Week:   . Minutes of Exercise per Session:   Stress:   . Feeling of Stress :   Social  Connections:   . Frequency of Communication with Friends and Family:   . Frequency of Social Gatherings with Friends and Family:   . Attends Religious Services:   . Active Member of Clubs or Organizations:   . Attends Banker Meetings:   Marland Kitchen Marital Status:   Intimate Partner Violence:   . Fear of Current or Ex-Partner:   . Emotionally Abused:   Marland Kitchen Physically Abused:   . Sexually Abused:    Family History  Problem Relation Age of Onset  . Migraines Mother   . Migraines Maternal Grandmother   . Migraines Maternal Aunt     OBJECTIVE:  Vitals:   12/09/19 0846  BP: 117/67  Pulse: 86  Resp: 18  Temp: 99.1 F (37.3 C)  TempSrc: Oral  SpO2: 100%     General appearance: alert;  appears fatigued, but nontoxic; speaking in full sentences and tolerating own secretions HEENT: NCAT; Ears: EACs clear, TMs pearly gray; Eyes: PERRL.  EOM grossly intact. Sinuses: nontender; Nose: nares patent without rhinorrhea, Throat: oropharynx clear, tonsils non erythematous or enlarged, uvula midline  Neck: supple without LAD Lungs: unlabored respirations, symmetrical air entry; cough: absent; no respiratory distress; CTAB Heart: regular rate and rhythm.  Radial pulses 2+ symmetrical bilaterally Skin: warm and dry Psychological: alert and cooperative; normal mood and affect  LABS:  No results found for this or any previous visit (from the past 24 hour(s)).   ASSESSMENT & PLAN:  1. Viral illness   2. Exposure to COVID-19 virus   3. Encounter for screening for COVID-19   4. Sore throat   5. Nasal congestion   6. Nonintractable headache, unspecified chronicity pattern, unspecified headache type   7. Cough   8. Diarrhea, unspecified type     No orders of the defined types were placed in this encounter.    COVID testing ordered.  It will take between 1-2 days for test results.  Someone will contact you regarding abnormal results.    Patient should remain in quarantine until they have received Covid results.  If negative you may resume normal activities (go back to work/school) while practicing hand hygiene, social distance, and mask wearing.  If positive, patient should remain in quarantine for 10 days from symptom onset AND greater than 72 hours after symptoms resolution (absence of fever without the use of fever-reducing medication and improvement in respiratory symptoms), whichever is longer Get plenty of rest and push fluids Use OTC zyrtec for nasal congestion, runny nose, and/or sore throat Use OTC flonase for nasal congestion and runny nose Use medications daily for symptom relief Use OTC medications like ibuprofen or tylenol as needed fever or pain Call or go to the ED if  you have any new or worsening symptoms such as fever, worsening cough, shortness of breath, chest tightness, chest pain, turning blue, changes in mental status.  Reviewed expectations re: course of current medical issues. Questions answered. Outlined signs and symptoms indicating need for more acute intervention. Patient verbalized understanding. After Visit Summary given.         Moshe Cipro, NP 12/09/19 [phone]

## 2020-02-10 DIAGNOSIS — R399 Unspecified symptoms and signs involving the genitourinary system: Secondary | ICD-10-CM | POA: Diagnosis not present

## 2020-02-10 DIAGNOSIS — K5900 Constipation, unspecified: Secondary | ICD-10-CM | POA: Diagnosis not present

## 2020-02-10 DIAGNOSIS — R1030 Lower abdominal pain, unspecified: Secondary | ICD-10-CM | POA: Diagnosis not present

## 2020-04-21 DIAGNOSIS — Z043 Encounter for examination and observation following other accident: Secondary | ICD-10-CM | POA: Diagnosis not present

## 2020-04-21 DIAGNOSIS — M62838 Other muscle spasm: Secondary | ICD-10-CM | POA: Diagnosis not present

## 2020-04-21 DIAGNOSIS — M25512 Pain in left shoulder: Secondary | ICD-10-CM | POA: Diagnosis not present

## 2020-05-03 ENCOUNTER — Encounter: Payer: Self-pay | Admitting: Family Medicine

## 2020-05-09 ENCOUNTER — Ambulatory Visit (HOSPITAL_COMMUNITY)
Admission: EM | Admit: 2020-05-09 | Discharge: 2020-05-09 | Disposition: A | Discharge status: Home / Self Care | Payer: Medicaid Other | Attending: Urgent Care | Admitting: Urgent Care

## 2020-05-09 ENCOUNTER — Other Ambulatory Visit: Payer: Self-pay

## 2020-05-09 ENCOUNTER — Encounter (HOSPITAL_COMMUNITY): Payer: Self-pay | Admitting: Emergency Medicine

## 2020-05-09 DIAGNOSIS — Z20822 Contact with and (suspected) exposure to covid-19: Secondary | ICD-10-CM | POA: Insufficient documentation

## 2020-05-09 DIAGNOSIS — B349 Viral infection, unspecified: Secondary | ICD-10-CM

## 2020-05-09 LAB — SARS CORONAVIRUS 2 (TAT 6-24 HRS): SARS Coronavirus 2: NEGATIVE

## 2020-05-09 NOTE — Discharge Instructions (Signed)

## 2020-05-09 NOTE — ED Provider Notes (Signed)
[facility]   MRN: [id number] DOB: 02/21/2001  Subjective:   Patricia [name] is a 20 y.o. female presenting for 3-day history of acute onset throat pain, headache, fatigue.  Patient states that her symptoms have improved and are mild.  She just wants COVID-19 test.  Her multiple exposures like Christmas.  No current facility-administered medications for this encounter.  Current Outpatient Medications:  .  dicyclomine (BENTYL) 20 MG tablet, Take 1 tablet (20 mg total) by mouth 2 (two) times daily as needed for spasms. (Patient taking differently: Take 20 mg by mouth 3 (three) times daily as needed for spasms. ), Disp: 20 tablet, Rfl: 0 .  EPINEPHrine (EPIPEN 2-PAK) 0.3 mg/0.3 mL IJ SOAJ injection, Inject 0.3 mLs (0.3 mg total) into the muscle once as needed. (Patient taking differently: Inject 0.3 mg into the muscle once as needed (allergic reaction). ), Disp: 2 Device, Rfl: 0 .  fluticasone (FLONASE) 50 MCG/ACT nasal spray, Place 1-2 sprays into both nostrils daily for 7 days., Disp: 1 g, Rfl: 0 .  ondansetron (ZOFRAN ODT) 4 MG disintegrating tablet, Take 1 tablet (4 mg total) by mouth every 8 (eight) hours as needed for nausea or vomiting., Disp: 20 tablet, Rfl: 0 .  SODIUM FLUORIDE 5000 PPM 1.1 % PSTE, Take by mouth., Disp: , Rfl:    Allergies to pineapple, penicillins.  Past Medical History:  Diagnosis Date  . Allergic   . Allergic rhinitis   . Generalized headaches      Past Surgical History:  Procedure Laterality Date  . TONSILLECTOMY AND ADENOIDECTOMY  2004    Family History  Problem Relation Age of Onset  . Migraines Mother   . Migraines Maternal Grandmother   . Migraines Maternal Aunt     Social History   Tobacco Use  . Smoking status: Passive Smoke Exposure - Never Smoker  . Smokeless tobacco: Never Used  . Tobacco comment: Mom smokes outside only, pt lives alone currently (09/12/19)  Substance Use Topics  . Alcohol use: No    Alcohol/week: 0.0  standard drinks  . Drug use: No    ROS   Objective:   Vitals: BP 95/60 (BP Location: Left Arm)   Pulse 62   Temp 98.2 F (36.8 C) (Oral)   Resp 16   LMP 05/01/2020   SpO2 99%   Physical Exam Constitutional:      General: She is not in acute distress.    Appearance: Normal appearance. She is well-developed. She is not ill-appearing, toxic-appearing or diaphoretic.  HENT:     Head: Normocephalic and atraumatic.     Nose: Nose normal.     Mouth/Throat:     Mouth: Mucous membranes are moist.  Eyes:     Extraocular Movements: Extraocular movements intact.     Pupils: Pupils are equal, round, and reactive to light.  Cardiovascular:     Rate and Rhythm: Normal rate and regular rhythm.     Pulses: Normal pulses.     Heart sounds: Normal heart sounds. No murmur heard. No friction rub. No gallop.   Pulmonary:     Effort: Pulmonary effort is normal. No respiratory distress.     Breath sounds: Normal breath sounds. No stridor. No wheezing, rhonchi or rales.  Skin:    General: Skin is warm and dry.     Findings: No rash.  Neurological:     Mental Status: She is alert and oriented to person, place, and time.  Psychiatric:  Mood and Affect: Mood normal.        Behavior: Behavior normal.        Thought Content: Thought content normal.     Assessment and Plan :   PDMP not reviewed this encounter.  1. Viral syndrome     Patient presents for COVID-19 test.  Recommend supportive care. Counseled patient on potential for adverse effects with medications prescribed/recommended today, ER and return-to-clinic precautions discussed, patient verbalized understanding.    Wallis Bamberg, PA-C 05/09/20 1303

## 2020-05-09 NOTE — ED Triage Notes (Signed)
Pt presents with sore throat, headache and fatigue xs 2-3 days. States has had multiple exposures since Christmas.

## 2020-06-07 DIAGNOSIS — H5203 Hypermetropia, bilateral: Secondary | ICD-10-CM | POA: Diagnosis not present

## 2020-07-06 ENCOUNTER — Other Ambulatory Visit: Payer: Self-pay

## 2020-07-06 ENCOUNTER — Emergency Department (HOSPITAL_COMMUNITY)
Admission: EM | Admit: 2020-07-06 | Discharge: 2020-07-06 | Disposition: A | Discharge status: Home / Self Care | Payer: Medicaid Other | Attending: Emergency Medicine | Admitting: Emergency Medicine

## 2020-07-06 ENCOUNTER — Encounter (HOSPITAL_COMMUNITY): Payer: Self-pay

## 2020-07-06 DIAGNOSIS — Z711 Person with feared health complaint in whom no diagnosis is made: Secondary | ICD-10-CM | POA: Diagnosis not present

## 2020-07-06 DIAGNOSIS — Z7722 Contact with and (suspected) exposure to environmental tobacco smoke (acute) (chronic): Secondary | ICD-10-CM | POA: Diagnosis not present

## 2020-07-06 DIAGNOSIS — Z113 Encounter for screening for infections with a predominantly sexual mode of transmission: Secondary | ICD-10-CM | POA: Diagnosis not present
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
## 2020-07-06 NOTE — ED Triage Notes (Signed)
Pt has no complaints but states she was told she may have been exposed to herpes

## 2020-07-06 NOTE — Discharge Instructions (Signed)
Thank you for allowing me to care for you today in the Emergency Department.   Please follow-up with your primary care provider for routine STD screenings.  You can also go to the [facility].  I would recommend going to their website as you can typically make an appointment to have testing performed.  Return to the emergency department if you develop high fevers, severe pelvic pain, uncontrollable vomiting, or other new, concerning symptoms.

## 2020-07-06 NOTE — ED Notes (Signed)
Pt ambulatory to WTR9. Vitals taken. A&Ox4. Respirations regular/unlabored. Denies pain at this time. Awaiting MD.
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
## 2020-08-12 ENCOUNTER — Encounter (HOSPITAL_COMMUNITY): Payer: Self-pay

## 2020-08-12 ENCOUNTER — Inpatient Hospital Stay (HOSPITAL_COMMUNITY): Payer: No Typology Code available for payment source | Admitting: Certified Registered Nurse Anesthetist

## 2020-08-12 ENCOUNTER — Other Ambulatory Visit: Payer: Self-pay

## 2020-08-12 ENCOUNTER — Emergency Department (HOSPITAL_COMMUNITY): Payer: No Typology Code available for payment source

## 2020-08-12 ENCOUNTER — Encounter (HOSPITAL_COMMUNITY)
Admission: EM | Disposition: A | Discharge status: Home / Self Care | Payer: Self-pay | Source: Home / Self Care | Attending: Orthopaedic Surgery

## 2020-08-12 ENCOUNTER — Inpatient Hospital Stay (HOSPITAL_COMMUNITY): Payer: No Typology Code available for payment source

## 2020-08-12 ENCOUNTER — Inpatient Hospital Stay (HOSPITAL_COMMUNITY)
Admission: EM | Admit: 2020-08-12 | Discharge: 2020-08-16 | DRG: 482 | Disposition: A | Discharge status: Home / Self Care | Payer: No Typology Code available for payment source | Attending: Orthopaedic Surgery | Admitting: Orthopaedic Surgery

## 2020-08-12 DIAGNOSIS — Z419 Encounter for procedure for purposes other than remedying health state, unspecified: Secondary | ICD-10-CM

## 2020-08-12 DIAGNOSIS — S72041A Displaced fracture of base of neck of right femur, initial encounter for closed fracture: Secondary | ICD-10-CM

## 2020-08-12 DIAGNOSIS — S72001A Fracture of unspecified part of neck of right femur, initial encounter for closed fracture: Secondary | ICD-10-CM | POA: Diagnosis present

## 2020-08-12 DIAGNOSIS — T1490XA Injury, unspecified, initial encounter: Secondary | ICD-10-CM

## 2020-08-12 DIAGNOSIS — Z20822 Contact with and (suspected) exposure to covid-19: Secondary | ICD-10-CM | POA: Diagnosis present

## 2020-08-12 DIAGNOSIS — S72011A Unspecified intracapsular fracture of right femur, initial encounter for closed fracture: Secondary | ICD-10-CM | POA: Diagnosis not present

## 2020-08-12 DIAGNOSIS — I959 Hypotension, unspecified: Secondary | ICD-10-CM | POA: Diagnosis not present

## 2020-08-12 DIAGNOSIS — R000 Tachycardia, unspecified: Secondary | ICD-10-CM | POA: Diagnosis not present

## 2020-08-12 DIAGNOSIS — Z041 Encounter for examination and observation following transport accident: Secondary | ICD-10-CM | POA: Diagnosis not present

## 2020-08-12 DIAGNOSIS — Z5181 Encounter for therapeutic drug level monitoring: Secondary | ICD-10-CM | POA: Diagnosis not present

## 2020-08-12 DIAGNOSIS — R339 Retention of urine, unspecified: Secondary | ICD-10-CM | POA: Diagnosis not present

## 2020-08-12 DIAGNOSIS — Y92410 Unspecified street and highway as the place of occurrence of the external cause: Secondary | ICD-10-CM

## 2020-08-12 DIAGNOSIS — M545 Low back pain, unspecified: Secondary | ICD-10-CM | POA: Diagnosis not present

## 2020-08-12 DIAGNOSIS — Z880 Allergy status to penicillin: Secondary | ICD-10-CM

## 2020-08-12 DIAGNOSIS — M549 Dorsalgia, unspecified: Secondary | ICD-10-CM | POA: Diagnosis not present

## 2020-08-12 DIAGNOSIS — F17210 Nicotine dependence, cigarettes, uncomplicated: Secondary | ICD-10-CM | POA: Diagnosis present

## 2020-08-12 DIAGNOSIS — S27322A Contusion of lung, bilateral, initial encounter: Secondary | ICD-10-CM | POA: Diagnosis not present

## 2020-08-12 DIAGNOSIS — M25572 Pain in left ankle and joints of left foot: Secondary | ICD-10-CM | POA: Diagnosis not present

## 2020-08-12 HISTORY — PX: ORIF FEMUR FRACTURE: SHX2119

## 2020-08-12 LAB — SAMPLE TO BLOOD BANK

## 2020-08-12 LAB — URINALYSIS, ROUTINE W REFLEX MICROSCOPIC
Bilirubin Urine: NEGATIVE
Glucose, UA: NEGATIVE mg/dL
Hgb urine dipstick: NEGATIVE
Ketones, ur: NEGATIVE mg/dL
Nitrite: NEGATIVE
Protein, ur: NEGATIVE mg/dL
Specific Gravity, Urine: 1.043 — ABNORMAL HIGH (ref 1.005–1.030)
pH: 6 (ref 5.0–8.0)

## 2020-08-12 LAB — HIV ANTIBODY (ROUTINE TESTING W REFLEX): HIV Screen 4th Generation wRfx: NONREACTIVE

## 2020-08-12 LAB — COMPREHENSIVE METABOLIC PANEL
ALT: 61 U/L — ABNORMAL HIGH (ref 0–44)
AST: 94 U/L — ABNORMAL HIGH (ref 15–41)
Albumin: 4 g/dL (ref 3.5–5.0)
Alkaline Phosphatase: 38 U/L (ref 38–126)
Anion gap: 8 (ref 5–15)
BUN: 8 mg/dL (ref 6–20)
CO2: 20 mmol/L — ABNORMAL LOW (ref 22–32)
Calcium: 8.5 mg/dL — ABNORMAL LOW (ref 8.9–10.3)
Chloride: 106 mmol/L (ref 98–111)
Creatinine, Ser: 0.9 mg/dL (ref 0.44–1.00)
GFR, Estimated: >60 mL/min (ref >60)
Glucose, Bld: 115 mg/dL — ABNORMAL HIGH (ref 70–99)
Potassium: 3 mmol/L — ABNORMAL LOW (ref 3.5–5.1)
Sodium: 134 mmol/L — ABNORMAL LOW (ref 135–145)
Total Bilirubin: 0.3 mg/dL (ref 0.3–1.2)
Total Protein: 6.2 g/dL — ABNORMAL LOW (ref 6.5–8.1)

## 2020-08-12 LAB — TYPE AND SCREEN
ABO/RH(D): A POS
Antibody Screen: NEGATIVE
Unit division: 0

## 2020-08-12 LAB — I-STAT CHEM 8, ED
BUN: 8 mg/dL (ref 6–20)
Calcium, Ion: 1.12 mmol/L — ABNORMAL LOW (ref 1.15–1.40)
Chloride: 104 mmol/L (ref 98–111)
Creatinine, Ser: 0.9 mg/dL (ref 0.44–1.00)
Glucose, Bld: 109 mg/dL — ABNORMAL HIGH (ref 70–99)
HCT: 40 % (ref 36.0–46.0)
Hemoglobin: 13.6 g/dL (ref 12.0–15.0)
Potassium: 3.1 mmol/L — ABNORMAL LOW (ref 3.5–5.1)
Sodium: 139 mmol/L (ref 135–145)
TCO2: 20 mmol/L — ABNORMAL LOW (ref 22–32)

## 2020-08-12 LAB — CBC
HCT: 41.1 % (ref 36.0–46.0)
Hemoglobin: 13.5 g/dL (ref 12.0–15.0)
MCH: 31 pg (ref 26.0–34.0)
MCHC: 32.8 g/dL (ref 30.0–36.0)
MCV: 94.5 fL (ref 80.0–100.0)
Platelets: 191 10*3/uL (ref 150–400)
RBC: 4.35 MIL/uL (ref 3.87–5.11)
RDW: 11.9 % (ref 11.5–15.5)
WBC: 8.8 10*3/uL (ref 4.0–10.5)
nRBC: 0 % (ref 0.0–0.2)

## 2020-08-12 LAB — ETHANOL: Alcohol, Ethyl (B): 57 mg/dL — ABNORMAL HIGH (ref <10)

## 2020-08-12 LAB — RESP PANEL BY RT-PCR (FLU A&B, COVID) ARPGX2
Influenza A by PCR: NEGATIVE
Influenza B by PCR: NEGATIVE
SARS Coronavirus 2 by RT PCR: NEGATIVE

## 2020-08-12 LAB — BPAM RBC
Blood Product Expiration Date: 202205042359
ISSUE DATE / TIME: 202204090056
Unit Type and Rh: 5100

## 2020-08-12 LAB — PROTIME-INR
INR: 1.1 (ref 0.8–1.2)
Prothrombin Time: 14.1 seconds (ref 11.4–15.2)

## 2020-08-12 LAB — LACTIC ACID, PLASMA
Lactic Acid, Venous: 3.6 mmol/L (ref 0.5–1.9)
Lactic Acid, Venous: 1.7 mmol/L (ref 0.5–1.9)

## 2020-08-12 LAB — MRSA PCR SCREENING: MRSA by PCR: NEGATIVE

## 2020-08-12 LAB — ABO/RH: ABO/RH(D): A POS

## 2020-08-12 LAB — I-STAT BETA HCG BLOOD, ED (MC, WL, AP ONLY): I-stat hCG, quantitative: <5 m[IU]/mL (ref <5)

## 2020-08-12 SURGERY — OPEN REDUCTION INTERNAL FIXATION (ORIF) DISTAL FEMUR FRACTURE
Anesthesia: General | Site: Hip | Laterality: Right

## 2020-08-12 MED ORDER — FENTANYL CITRATE (PF) 100 MCG/2ML IJ SOLN
50.0000 ug | Freq: Once | INTRAMUSCULAR | Status: AC
Start: 2020-08-12 — End: 2020-08-12

## 2020-08-12 MED ORDER — ONDANSETRON HCL 4 MG PO TABS: 4.0000 mg | ORAL_TABLET | Freq: Four times a day (QID) | ORAL | Status: DC | PRN

## 2020-08-12 MED ORDER — PHENYLEPHRINE 40 MCG/ML (10ML) SYRINGE FOR IV PUSH (FOR BLOOD PRESSURE SUPPORT)
PREFILLED_SYRINGE | INTRAVENOUS | Status: DC | PRN
  Administered 2020-08-12: 40 ug via INTRAVENOUS
  Administered 2020-08-12: 120 ug via INTRAVENOUS
  Administered 2020-08-12: 80 ug via INTRAVENOUS
  Administered 2020-08-12: 120 ug via INTRAVENOUS

## 2020-08-12 MED ORDER — LIDOCAINE 2% (20 MG/ML) 5 ML SYRINGE
INTRAMUSCULAR | Status: DC | PRN
  Administered 2020-08-12: 60 mg via INTRAVENOUS

## 2020-08-12 MED ORDER — FENTANYL CITRATE (PF) 100 MCG/2ML IJ SOLN
25.0000 ug | INTRAMUSCULAR | Status: DC | PRN
  Administered 2020-08-12 (×2): 50 ug via INTRAVENOUS

## 2020-08-12 MED ORDER — ONDANSETRON HCL 4 MG/2ML IJ SOLN: 4.0000 mg | Freq: Four times a day (QID) | INTRAMUSCULAR | Status: DC | PRN

## 2020-08-12 MED ORDER — MIDAZOLAM HCL 2 MG/2ML IJ SOLN
INTRAMUSCULAR | Status: DC | PRN
  Administered 2020-08-12: 2 mg via INTRAVENOUS

## 2020-08-12 MED ORDER — FENTANYL CITRATE (PF) 100 MCG/2ML IJ SOLN
INTRAMUSCULAR | Status: AC
  Filled 2020-08-12: qty 2

## 2020-08-12 MED ORDER — PHENYLEPHRINE HCL-NACL 10-0.9 MG/250ML-% IV SOLN
INTRAVENOUS | Status: DC | PRN
  Administered 2020-08-12: 30 ug/min via INTRAVENOUS

## 2020-08-12 MED ORDER — SODIUM CHLORIDE 0.9 % IV BOLUS
1000.0000 mL | Freq: Once | INTRAVENOUS | Status: AC
  Administered 2020-08-12: 1000 mL via INTRAVENOUS

## 2020-08-12 MED ORDER — SODIUM CHLORIDE 0.9 % IV SOLN: INTRAVENOUS | Status: DC

## 2020-08-12 MED ORDER — METOCLOPRAMIDE HCL 10 MG PO TABS
5.0000 mg | ORAL_TABLET | Freq: Three times a day (TID) | ORAL | Status: DC | PRN
Start: 2020-08-12 — End: 2020-08-12

## 2020-08-12 MED ORDER — DIPHENHYDRAMINE HCL 12.5 MG/5ML PO ELIX
12.5000 mg | ORAL_SOLUTION | ORAL | Status: DC | PRN
Start: 2020-08-12 — End: 2020-08-16
  Administered 2020-08-12 – 2020-08-15 (×5): 25 mg via ORAL
  Filled 2020-08-12 (×5): qty 10

## 2020-08-12 MED ORDER — ACETAMINOPHEN 325 MG PO TABS
325.0000 mg | ORAL_TABLET | Freq: Four times a day (QID) | ORAL | Status: DC | PRN
Start: 2020-08-13 — End: 2020-08-12

## 2020-08-12 MED ORDER — PROMETHAZINE HCL 25 MG/ML IJ SOLN: 6.2500 mg | INTRAMUSCULAR | Status: DC | PRN

## 2020-08-12 MED ORDER — FENTANYL CITRATE (PF) 100 MCG/2ML IJ SOLN: 50.0000 ug | INTRAMUSCULAR | Status: DC | PRN

## 2020-08-12 MED ORDER — DEXAMETHASONE SODIUM PHOSPHATE 10 MG/ML IJ SOLN
INTRAMUSCULAR | Status: DC | PRN
  Administered 2020-08-12: 10 mg via INTRAVENOUS

## 2020-08-12 MED ORDER — CEFAZOLIN SODIUM-DEXTROSE 2-3 GM-%(50ML) IV SOLR
INTRAVENOUS | Status: DC | PRN
  Administered 2020-08-12: 2 g via INTRAVENOUS

## 2020-08-12 MED ORDER — HYDROMORPHONE HCL 1 MG/ML IJ SOLN
0.5000 mg | INTRAMUSCULAR | Status: DC | PRN
  Administered 2020-08-12 – 2020-08-15 (×11): 1 mg via INTRAVENOUS
  Administered 2020-08-15: 0.5 mg via INTRAVENOUS
  Filled 2020-08-12 (×11): qty 1

## 2020-08-12 MED ORDER — ACETAMINOPHEN 325 MG PO TABS: 325.0000 mg | ORAL_TABLET | Freq: Four times a day (QID) | ORAL | Status: DC | PRN

## 2020-08-12 MED ORDER — OXYCODONE HCL 5 MG PO TABS
5.0000 mg | ORAL_TABLET | ORAL | Status: DC | PRN
  Administered 2020-08-12 – 2020-08-14 (×4): 10 mg via ORAL
  Filled 2020-08-12 (×6): qty 2

## 2020-08-12 MED ORDER — OXYCODONE-ACETAMINOPHEN 5-325 MG PO TABS: 1.0000 | ORAL_TABLET | ORAL | Status: DC | PRN

## 2020-08-12 MED ORDER — METHOCARBAMOL 500 MG PO TABS: 500.0000 mg | ORAL_TABLET | Freq: Four times a day (QID) | ORAL | Status: DC | PRN

## 2020-08-12 MED ORDER — FENTANYL CITRATE (PF) 100 MCG/2ML IJ SOLN
INTRAMUSCULAR | Status: AC
  Administered 2020-08-12: 50 ug via INTRAVENOUS
  Filled 2020-08-12: qty 2

## 2020-08-12 MED ORDER — MENTHOL 3 MG MT LOZG: 1.0000 | LOZENGE | OROMUCOSAL | Status: DC | PRN

## 2020-08-12 MED ORDER — CEFAZOLIN SODIUM-DEXTROSE 2-4 GM/100ML-% IV SOLN
2.0000 g | Freq: Four times a day (QID) | INTRAVENOUS | Status: AC
  Administered 2020-08-12 (×2): 2 g via INTRAVENOUS
  Filled 2020-08-12 (×2): qty 100

## 2020-08-12 MED ORDER — PROPOFOL 10 MG/ML IV BOLUS
INTRAVENOUS | Status: AC
  Filled 2020-08-12: qty 20

## 2020-08-12 MED ORDER — ROCURONIUM BROMIDE 10 MG/ML (PF) SYRINGE
PREFILLED_SYRINGE | INTRAVENOUS | Status: DC | PRN
  Administered 2020-08-12: 60 mg via INTRAVENOUS

## 2020-08-12 MED ORDER — KETAMINE HCL 10 MG/ML IJ SOLN
INTRAMUSCULAR | Status: DC | PRN
  Administered 2020-08-12: 30 mg via INTRAVENOUS

## 2020-08-12 MED ORDER — MIDAZOLAM HCL 2 MG/2ML IJ SOLN
INTRAMUSCULAR | Status: AC
  Filled 2020-08-12: qty 2

## 2020-08-12 MED ORDER — FENTANYL CITRATE (PF) 100 MCG/2ML IJ SOLN
50.0000 ug | Freq: Once | INTRAMUSCULAR | Status: AC
  Administered 2020-08-12: 50 ug via INTRAVENOUS
  Filled 2020-08-12: qty 2

## 2020-08-12 MED ORDER — OXYCODONE HCL 5 MG PO TABS
10.0000 mg | ORAL_TABLET | ORAL | Status: DC | PRN
  Administered 2020-08-13 (×2): 10 mg via ORAL
  Administered 2020-08-13 – 2020-08-14 (×2): 15 mg via ORAL
  Administered 2020-08-14: 10 mg via ORAL
  Administered 2020-08-15 – 2020-08-16 (×7): 15 mg via ORAL
  Filled 2020-08-12 (×10): qty 3
  Filled 2020-08-12: qty 2

## 2020-08-12 MED ORDER — METHOCARBAMOL 1000 MG/10ML IJ SOLN: 500.0000 mg | Freq: Four times a day (QID) | INTRAVENOUS | Status: DC | PRN

## 2020-08-12 MED ORDER — OXYCODONE HCL 5 MG PO TABS: 5.0000 mg | ORAL_TABLET | ORAL | Status: DC | PRN

## 2020-08-12 MED ORDER — ONDANSETRON HCL 4 MG/2ML IJ SOLN
INTRAMUSCULAR | Status: DC | PRN
  Administered 2020-08-12: 4 mg via INTRAVENOUS

## 2020-08-12 MED ORDER — LACTATED RINGERS IV SOLN: INTRAVENOUS | Status: DC | PRN

## 2020-08-12 MED ORDER — FENTANYL CITRATE (PF) 250 MCG/5ML IJ SOLN
INTRAMUSCULAR | Status: DC | PRN
  Administered 2020-08-12: 100 ug via INTRAVENOUS

## 2020-08-12 MED ORDER — METHOCARBAMOL 1000 MG/10ML IJ SOLN
500.0000 mg | Freq: Four times a day (QID) | INTRAVENOUS | Status: DC | PRN
  Filled 2020-08-12 (×4): qty 5

## 2020-08-12 MED ORDER — PROPOFOL 10 MG/ML IV BOLUS
INTRAVENOUS | Status: DC | PRN
  Administered 2020-08-12: 150 mg via INTRAVENOUS
  Administered 2020-08-12: 50 mg via INTRAVENOUS

## 2020-08-12 MED ORDER — POTASSIUM CHLORIDE IN NACL 20-0.9 MEQ/L-% IV SOLN
INTRAVENOUS | Status: DC
  Filled 2020-08-12: qty 1000

## 2020-08-12 MED ORDER — IOHEXOL 350 MG/ML SOLN
100.0000 mL | Freq: Once | INTRAVENOUS | Status: AC | PRN
  Administered 2020-08-12: 100 mL via INTRAVENOUS

## 2020-08-12 MED ORDER — ACETAMINOPHEN 10 MG/ML IV SOLN
INTRAVENOUS | Status: DC | PRN
  Administered 2020-08-12: 1000 mg via INTRAVENOUS

## 2020-08-12 MED ORDER — KETAMINE HCL 50 MG/5ML IJ SOSY
PREFILLED_SYRINGE | INTRAMUSCULAR | Status: AC
  Filled 2020-08-12: qty 5

## 2020-08-12 MED ORDER — CEFAZOLIN SODIUM-DEXTROSE 2-4 GM/100ML-% IV SOLN
INTRAVENOUS | Status: AC
  Filled 2020-08-12: qty 100

## 2020-08-12 MED ORDER — METOCLOPRAMIDE HCL 5 MG/ML IJ SOLN: 5.0000 mg | Freq: Three times a day (TID) | INTRAMUSCULAR | Status: DC | PRN

## 2020-08-12 MED ORDER — PHENOL 1.4 % MT LIQD: 1.0000 | OROMUCOSAL | Status: DC | PRN

## 2020-08-12 MED ORDER — 0.9 % SODIUM CHLORIDE (POUR BTL) OPTIME
TOPICAL | Status: DC | PRN
  Administered 2020-08-12: 1000 mL

## 2020-08-12 MED ORDER — HYDROMORPHONE HCL 1 MG/ML IJ SOLN
0.5000 mg | INTRAMUSCULAR | Status: DC | PRN
Start: 2020-08-12 — End: 2020-08-12
  Administered 2020-08-12: 1 mg via INTRAVENOUS
  Filled 2020-08-12 (×2): qty 1

## 2020-08-12 MED ORDER — OXYCODONE HCL 5 MG PO TABS
10.0000 mg | ORAL_TABLET | ORAL | Status: DC | PRN
Start: 2020-08-12 — End: 2020-08-12

## 2020-08-12 MED ORDER — FENTANYL CITRATE (PF) 250 MCG/5ML IJ SOLN
INTRAMUSCULAR | Status: AC
  Filled 2020-08-12: qty 5

## 2020-08-12 MED ORDER — METOCLOPRAMIDE HCL 10 MG PO TABS: 5.0000 mg | ORAL_TABLET | Freq: Three times a day (TID) | ORAL | Status: DC | PRN

## 2020-08-12 MED ORDER — METHOCARBAMOL 500 MG PO TABS
500.0000 mg | ORAL_TABLET | Freq: Four times a day (QID) | ORAL | Status: DC | PRN
Start: 2020-08-12 — End: 2020-08-16
  Administered 2020-08-12 – 2020-08-16 (×9): 500 mg via ORAL
  Filled 2020-08-12 (×10): qty 1

## 2020-08-12 MED ORDER — CHLORHEXIDINE GLUCONATE 0.12 % MT SOLN
OROMUCOSAL | Status: AC
  Filled 2020-08-12: qty 15

## 2020-08-12 MED ORDER — SUGAMMADEX SODIUM 200 MG/2ML IV SOLN
INTRAVENOUS | Status: DC | PRN
  Administered 2020-08-12: 200 mg via INTRAVENOUS

## 2020-08-12 SURGICAL SUPPLY — 50 items
BIT DRILL 4.8MMDIAX5IN DISPOSE (BIT) ×1 IMPLANT
BIT DRILL 4.8X200 CANN (BIT) ×2 IMPLANT
DRAPE C-ARMOR (DRAPES) ×2 IMPLANT
DRAPE IMP U-DRAPE 54X76 (DRAPES) ×3 IMPLANT
DRAPE STERI IOBAN 125X83 (DRAPES) ×3 IMPLANT
DRAPE SURG 17X23 STRL (DRAPES) ×3 IMPLANT
DRESSING AQUACEL AG SP 3.5X6 (GAUZE/BANDAGES/DRESSINGS) IMPLANT
DRILL BIT 4.8MMDIAX5IN DISPOSE (BIT) ×3
DRSG ADAPTIC 3X8 NADH LF (GAUZE/BANDAGES/DRESSINGS) ×1 IMPLANT
DRSG AQUACEL AG SP 3.5X6 (GAUZE/BANDAGES/DRESSINGS) ×3
DRSG PAD ABDOMINAL 8X10 ST (GAUZE/BANDAGES/DRESSINGS) ×1 IMPLANT
DURAPREP 26ML APPLICATOR (WOUND CARE) ×3 IMPLANT
ELECT CAUTERY BLADE 6.4 (BLADE) IMPLANT
ELECT REM PT RETURN 9FT ADLT (ELECTROSURGICAL) ×3
ELECTRODE REM PT RTRN 9FT ADLT (ELECTROSURGICAL) ×1 IMPLANT
EVACUATOR 1/8 PVC DRAIN (DRAIN) IMPLANT
FACESHIELD STD STERILE (MASK) ×2 IMPLANT
GAUZE SPONGE 4X4 12PLY STRL (GAUZE/BANDAGES/DRESSINGS) ×1 IMPLANT
GLOVE BIO SURGEON STRL SZ8 (GLOVE) ×5 IMPLANT
GLOVE BIOGEL PI IND STRL 7.5 (GLOVE) ×1 IMPLANT
GLOVE BIOGEL PI INDICATOR 7.5 (GLOVE) ×6
GLOVE ORTHO TXT STRL SZ7.5 (GLOVE) ×3 IMPLANT
GLOVE SRG 8 PF TXTR STRL LF DI (GLOVE) ×1 IMPLANT
GLOVE SURG ORTHO 8.0 STRL STRW (GLOVE) ×7 IMPLANT
GLOVE SURG UNDER POLY LF SZ8 (GLOVE) ×6
GOWN STRL REUS W/ TWL LRG LVL3 (GOWN DISPOSABLE) ×3 IMPLANT
GOWN STRL REUS W/ TWL XL LVL3 (GOWN DISPOSABLE) ×2 IMPLANT
GOWN STRL REUS W/TWL LRG LVL3 (GOWN DISPOSABLE) ×6
GOWN STRL REUS W/TWL XL LVL3 (GOWN DISPOSABLE) ×9
KIT BASIN OR (CUSTOM PROCEDURE TRAY) ×3 IMPLANT
KIT TURNOVER KIT B (KITS) ×3 IMPLANT
MANIFOLD NEPTUNE II (INSTRUMENTS) ×1 IMPLANT
NS IRRIG 1000ML POUR BTL (IV SOLUTION) ×3 IMPLANT
PACK GENERAL/GYN (CUSTOM PROCEDURE TRAY) ×3 IMPLANT
PACK UNIVERSAL I (CUSTOM PROCEDURE TRAY) ×3 IMPLANT
PAD ARMBOARD 7.5X6 YLW CONV (MISCELLANEOUS) ×6 IMPLANT
PIN GUIDE DRILL TIP 2.8X300 (DRILL) ×6 IMPLANT
SCREW 16MM THREAD 6.5X75MM (Screw) ×2 IMPLANT
SCREW CANN THRD 6.5X70 (Screw) ×2 IMPLANT
SCREW CANN THRD 6.5X80 (Screw) ×2 IMPLANT
STAPLER VISISTAT 35W (STAPLE) ×3 IMPLANT
SUT VIC AB 0 CT1 27 (SUTURE) ×3
SUT VIC AB 0 CT1 27XBRD ANBCTR (SUTURE) ×3 IMPLANT
SUT VIC AB 1 CT1 27 (SUTURE) ×3
SUT VIC AB 1 CT1 27XBRD ANBCTR (SUTURE) ×1 IMPLANT
SUT VIC AB 2-0 CT1 27 (SUTURE) ×3
SUT VIC AB 2-0 CT1 TAPERPNT 27 (SUTURE) ×1 IMPLANT
TOWEL GREEN STERILE (TOWEL DISPOSABLE) ×3 IMPLANT
TOWEL GREEN STERILE FF (TOWEL DISPOSABLE) ×3 IMPLANT
WATER STERILE IRR 1000ML POUR (IV SOLUTION) ×1 IMPLANT
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
## 2020-08-12 NOTE — Transfer of Care (Signed)
Immediate Anesthesia Transfer of Care Note  Patient: Ulanda Tackett  Procedure(s) Performed: OPEN REDUCTION INTERNAL FIXATION (ORIF) HIP FRACTURE (Right Hip)  Patient Location: PACU  Anesthesia Type:General  Level of Consciousness: sedated  Airway & Oxygen Therapy: Patient Spontanous Breathing  Post-op Assessment: Report given to RN and Post -op Vital signs reviewed and stable  Post vital signs: Reviewed and stable  Last Vitals:  Vitals Value Taken Time  BP    Temp    Pulse 100 08/12/20 0856  Resp 15 08/12/20 0856  SpO2 98 % 08/12/20 0856  Vitals shown include unvalidated device data.  Last Pain:  Vitals:   08/12/20 0530  TempSrc: Axillary  PainSc: 10-Worst pain ever         Complications: No complications documented.

## 2020-08-12 NOTE — Anesthesia Postprocedure Evaluation (Signed)
Anesthesia Post Note  Patient: Patricia [name]  Procedure(s) Performed: OPEN REDUCTION INTERNAL FIXATION (ORIF) HIP FRACTURE (Right Hip)     Patient location during evaluation: PACU Anesthesia Type: General Level of consciousness: awake and alert, oriented and awake Pain management: pain level controlled Vital Signs Assessment: post-procedure vital signs reviewed and stable Respiratory status: spontaneous breathing, nonlabored ventilation, respiratory function stable and patient connected to nasal cannula oxygen Cardiovascular status: blood pressure returned to baseline and stable Postop Assessment: no apparent nausea or vomiting Anesthetic complications: no   No complications documented.  Last Vitals:  Vitals:   08/12/20 0935 08/12/20 0941  BP: 113/77 113/69  Pulse: 85 96  Resp: 20 20  Temp:  36.6 C  SpO2: 96% 100%    Last Pain:  Vitals:   08/12/20 0941  TempSrc:   PainSc: 2                  Cecile Hearing
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
## 2020-08-12 NOTE — ED Notes (Signed)
Pt back from ct

## 2020-08-12 NOTE — Anesthesia Procedure Notes (Signed)
Procedure Name: Intubation Date/Time: 08/12/2020 7:34 AM Performed by: Lelon Perla, CRNA Pre-anesthesia Checklist: Patient identified, Emergency Drugs available, Suction available and Patient being monitored Patient Re-evaluated:Patient Re-evaluated prior to induction Oxygen Delivery Method: Circle system utilized Preoxygenation: Pre-oxygenation with 100% oxygen Induction Type: IV induction Ventilation: Mask ventilation without difficulty Laryngoscope Size: Glidescope and 3 Grade View: Grade I Tube type: Oral Tube size: 7.0 mm Number of attempts: 1 Airway Equipment and Method: Stylet and Oral airway Placement Confirmation: ETT inserted through vocal cords under direct vision,  positive ETCO2 and breath sounds checked- equal and bilateral Secured at: 22 cm Tube secured with: Tape Dental Injury: Teeth and Oropharynx as per pre-operative assessment

## 2020-08-12 NOTE — ED Notes (Addendum)
Dr Stevie Kern notified of critical lactic 3.6 and pts request for more pain medication

## 2020-08-12 NOTE — ED Notes (Signed)
This rn taking Pt to ct on cardiac monitor.

## 2020-08-12 NOTE — Brief Op Note (Signed)
08/12/2020  8:38 AM  PATIENT:  Patricia [name]  20 y.o. female  PRE-OPERATIVE DIAGNOSIS:  RIGHT HIP FRACTURE  POST-OPERATIVE DIAGNOSIS:  RIGHT HIP FRACTURE  PROCEDURE:  Procedure(s): OPEN REDUCTION INTERNAL FIXATION (ORIF) HIP FRACTURE (Right)  SURGEON:  Surgeon(s) and Role:    Kathryne Hitch, MD - Primary  PHYSICIAN ASSISTANT:  Rexene Edison, PA-C  ANESTHESIA:   general  EBL:  50 mL   COUNTS:  YES  DICTATION: .Other Dictation: Dictation Number [id number]  PLAN OF CARE: Admit to inpatient   PATIENT DISPOSITION:  PACU - hemodynamically stable.   Delay start of Pharmacological VTE agent (>24hrs) due to surgical blood loss or risk of bleeding: no
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
## 2020-08-13 NOTE — Plan of Care (Signed)
Patient is still in pain and taking iv medicine. Patient would like to have inpatient rehab because she does not have help at home. Right leg is still NWB.

## 2020-08-13 NOTE — Progress Notes (Signed)
Subjective: 1 Day Post-Op Procedure(s) (LRB): OPEN REDUCTION INTERNAL FIXATION (ORIF) HIP FRACTURE (Right) Patient reports pain as moderate.  No complaints otherwise. Has not been up with PT.   Objective: Vital signs in last 24 hours: Temp:  [97.8 F (36.6 C)-99 F (37.2 C)] 98 F (36.7 C) (04/10 0748) Pulse Rate:  [44-100] 44 (04/10 0748) Resp:  [15-20] 20 (04/10 0748) BP: (98-113)/(56-84) 101/61 (04/10 0748) SpO2:  [95 %-100 %] 95 % (04/10 0748)  Intake/Output from previous day: 04/09 0701 - 04/10 0700 In: 427.9 [I.V.:277.9; IV Piggyback:150] Out: 3960 [Urine:3910; Blood:50] Intake/Output this shift: No intake/output data recorded.  Recent Labs    08/12/20 0045 08/12/20 0053  HGB 13.5 13.6   Recent Labs    08/12/20 0045 08/12/20 0053  WBC 8.8  --   RBC 4.35  --   HCT 41.1 40.0  PLT 191  --    Recent Labs    08/12/20 0045 08/12/20 0053  NA 134* 139  K 3.0* 3.1*  CL 106 104  CO2 20*  --   BUN 8 8  CREATININE 0.90 0.90  GLUCOSE 115* 109*  CALCIUM 8.5*  --    Recent Labs    08/12/20 0045  INR 1.1    Right lower extremity: Sensation intact distally Intact pulses distally Dorsiflexion/Plantar flexion intact Incision: scant drainage Compartment soft   Assessment/Plan: 1 Day Post-Op Procedure(s) (LRB): OPEN REDUCTION INTERNAL FIXATION (ORIF) HIP FRACTURE (Right) Up with therapy non weight bearing right lower extremity.        Kenlee Maler 08/13/2020, 8:54 AM

## 2020-08-13 NOTE — Plan of Care (Signed)

## 2020-08-14 ENCOUNTER — Telehealth: Payer: Self-pay | Admitting: Physician Assistant

## 2020-08-14 NOTE — Progress Notes (Signed)
Subjective: 2 Days Post-Op Procedure(s) (LRB): OPEN REDUCTION INTERNAL FIXATION (ORIF) HIP FRACTURE (Right) Patient reports pain as severe.  Wanting to talk with Social worker about placement at [facility].   Objective: Vital signs in last 24 hours: Temp:  [97.8 F (36.6 C)-98.8 F (37.1 C)] 97.8 F (36.6 C) (04/11 1221) Pulse Rate:  [73-85] 73 (04/11 1221) Resp:  [20] 20 (04/11 1221) BP: (98-114)/(58-74) 112/74 (04/11 1221) SpO2:  [97 %-100 %] 100 % (04/11 0740)  Intake/Output from previous day: 04/10 0701 - 04/11 0700 In: 1610 [P.O.:1610] Out: 3100 [Urine:3100] Intake/Output this shift: Total I/O In: 120 [P.O.:120] Out: 0   Recent Labs    08/12/20 0045 08/12/20 0053  HGB 13.5 13.6   Recent Labs    08/12/20 0045 08/12/20 0053  WBC 8.8  --   RBC 4.35  --   HCT 41.1 40.0  PLT 191  --    Recent Labs    08/12/20 0045 08/12/20 0053  NA 134* 139  K 3.0* 3.1*  CL 106 104  CO2 20*  --   BUN 8 8  CREATININE 0.90 0.90  GLUCOSE 115* 109*  CALCIUM 8.5*  --    Recent Labs    08/12/20 0045  INR 1.1   Right lower Extremity: Intact pulses distally Dorsiflexion/Plantar flexion intact Incision: scant drainage Compartment soft   Assessment/Plan: 2 Days Post-Op Procedure(s) (LRB): OPEN REDUCTION INTERNAL FIXATION (ORIF) HIP FRACTURE (Right) Up with therapy  Social worker consult       Richardean Canal 08/14/2020, 12:24 PM

## 2020-08-14 NOTE — Progress Notes (Signed)
Inpatient Rehab Admissions Coordinator:   Mom inquiring about CIR.  Note pt mobilizing well with therapy yesterday.  Spoke to PT and OT today who feel pt continues to mobilize well and should be fine to d/c home.  No CIR recommended at this time.  I let RN CM, Alfonse Flavors, know.   Estill Dooms, PT, DPT Admissions Coordinator [phone] 08/14/20  11:56 AM
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
## 2020-08-14 NOTE — Telephone Encounter (Signed)
Notes completed

## 2020-08-14 NOTE — Progress Notes (Signed)
Patient ID: Patricia [name], female   DOB: 06-15-2000, 21 y.o.   MRN: [id number] I talked to the patient as well as her family at the bedside just now.  They have a better understanding that she does not really need any type of inpatient rehab or skilled nursing placement.  She is getting around well with crutches or even a walker.  We can certainly have the transitional care team help with getting her durable equipment needs and even potentially home health PT just for balance and coordination.  Should be nonweightbearing on that right hip for least the next 4 to 6 weeks but mainly just needs rest.  She does not need any extensive therapy and is mainly again about her mobility.  They did thank me and said that they have a better understanding of what is needed for her.  I feel like we can discharge her in another day or 2 to home instead.

## 2020-08-14 NOTE — Telephone Encounter (Signed)
Could you do 3 notes for Ms. Behringer 1 for the judge stating that she is still hospitalized and unable to make court date Another for work stating that she is hospitalized and I will go to work The third for her apartment stating that she is hospitalized at present time
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
## 2020-08-15 ENCOUNTER — Encounter (HOSPITAL_COMMUNITY): Payer: Self-pay | Admitting: Orthopaedic Surgery

## 2020-08-15 DIAGNOSIS — Z5181 Encounter for therapeutic drug level monitoring: Secondary | ICD-10-CM | POA: Diagnosis not present

## 2020-08-15 MED ORDER — BETHANECHOL CHLORIDE 10 MG PO TABS
5.0000 mg | ORAL_TABLET | Freq: Three times a day (TID) | ORAL | Status: DC
  Administered 2020-08-15 – 2020-08-16 (×3): 5 mg via ORAL
  Filled 2020-08-15 (×3): qty 1

## 2020-08-15 MED ORDER — TAMSULOSIN HCL 0.4 MG PO CAPS
0.4000 mg | ORAL_CAPSULE | Freq: Once | ORAL | Status: AC
  Administered 2020-08-15: 0.4 mg via ORAL
  Filled 2020-08-15: qty 1

## 2020-08-15 NOTE — Progress Notes (Cosign Needed)
   Durable Medical Equipment (From admission, onward)       Start     Ordered  08/15/20 1117   For home use only DME Tub bench  Once       08/15/20 1121  08/15/20 1117   For home use only DME 3 n 1  Once       08/15/20 1121  08/15/20 1117   For home use only DME Crutches  Once       08/15/20 1121  08/15/20 1117   For home use only DME lightweight manual wheelchair with seat cushion  Once      Comments: Patient suffers from Right hip femoral neck fx s/p ORIF and NRB which impairs their ability to perform daily activities like ADLs in the home.  A walking aid will not resolve  issue with performing activities of daily living. A wheelchair will allow patient to safely perform daily activities. Patient is not able to propel themselves in the home using a standard weight wheelchair due to weakness. Patient can self propel in the lightweight wheelchair. Length of need Timeframe: 6 months. Accessories: elevating leg rests (ELRs), wheel locks, extensions and anti-tippers. Back cushion  08/15/20 1121
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
## 2020-08-15 NOTE — Discharge Instructions (Signed)
Increase your activities as comfort allows. Remain non-weight bearing on your right hip/leg for 4-6 weeks. The right hip dressing can get wet in the shower.
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
## 2020-08-15 NOTE — Progress Notes (Signed)
Patient ID: Patricia [name], female   DOB: [birth year], 20 y.o.   MRN: [id number] The patient is progressing along with her mobility.  Her vital signs are stable.  Her right operative hip is stable.  The dressing is clean and dry.  She is still having problems with urinary retention.  She has had several in and out catheterizations.  Hopefully decreasing her pain medication and mobility will help.  She did receive 1 dose of Flomax.  If her urinary retention occurs again, nursing has her permission to put a Foley in and leave it in.  We would then need to get urology involved and potentially just as an outpatient.

## 2020-08-16 ENCOUNTER — Encounter (HOSPITAL_COMMUNITY): Payer: Self-pay

## 2020-08-16 MED ORDER — BETHANECHOL CHLORIDE 5 MG PO TABS: 5.0000 mg | ORAL_TABLET | Freq: Three times a day (TID) | ORAL | 0 refills | Status: DC

## 2020-08-16 MED ORDER — METHOCARBAMOL 500 MG PO TABS: 500.0000 mg | ORAL_TABLET | Freq: Four times a day (QID) | ORAL | 1 refills | Status: DC | PRN

## 2020-08-16 MED ORDER — OXYCODONE HCL 5 MG PO TABS: 5.0000 mg | ORAL_TABLET | ORAL | 0 refills | Status: DC | PRN
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
## 2020-08-16 NOTE — Progress Notes (Signed)
D/C education given to Pt and Pt's family member and all questions answered. No printed prescriptions to give and equipment delivered to the room prior to d/c. IVs removed. Pt taken to car with all belongings.
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
## 2020-08-16 NOTE — Progress Notes (Signed)
Subjective: 4 Days Post-Op Procedure(s) (LRB): OPEN REDUCTION INTERNAL FIXATION (ORIF) HIP FRACTURE (Right) Patient reports pain as moderate.  Reports voiding last night and this AM.   Objective: Vital signs in last 24 hours: Temp:  [97.6 F (36.4 C)-98.9 F (37.2 C)] 98.3 F (36.8 C) (04/13 0838) Pulse Rate:  [69-87] 69 (04/13 0838) Resp:  [15-18] 15 (04/13 0838) BP: (97-121)/(63-80) 106/72 (04/13 0838) SpO2:  [97 %-100 %] 97 % (04/13 0400)  Intake/Output from previous day: 04/12 0701 - 04/13 0700 In: 680 [P.O.:680] Out: 600 [Urine:600] Intake/Output this shift: No intake/output data recorded.  No results for input(s): HGB in the last 72 hours. No results for input(s): WBC, RBC, HCT, PLT in the last 72 hours. No results for input(s): NA, K, CL, CO2, BUN, CREATININE, GLUCOSE, CALCIUM in the last 72 hours. No results for input(s): LABPT, INR in the last 72 hours.  Right lower Extremity: Dorsiflexion/Plantar flexion intact Incision: dressing C/D/I Compartment soft   Assessment/Plan: 4 Days Post-Op Procedure(s) (LRB): OPEN REDUCTION INTERNAL FIXATION (ORIF) HIP FRACTURE (Right) Up with therapy Discharge home with home health  Urinary retention:Will continue Urecholine as out patient.     Patricia [name] 08/16/2020, 8:55 AM

## 2020-08-17 ENCOUNTER — Telehealth: Payer: Self-pay | Admitting: *Deleted

## 2020-08-17 NOTE — Telephone Encounter (Signed)
Transition Care Management Unsuccessful Follow-up Telephone Call  Date of discharge and from where:  08/16/2020 - [facility]  Attempts:  1st Attempt  Reason for unsuccessful TCM follow-up call:  Left voice message

## 2020-08-18 NOTE — Telephone Encounter (Signed)
Transition Care Management Follow-up Telephone Call  Date of discharge and from where: 08/16/2020 - [facility]  How have you been since you were released from the hospital? "In pain"  Any questions or concerns? No  Items Reviewed:  Did the pt receive and understand the discharge instructions provided? Yes   Medications obtained and verified? Yes   Other? No   Any new allergies since your discharge? No   Dietary orders reviewed? No  Do you have support at home? Yes   Home Care and Equipment/Supplies: Were home health services ordered? no If so, what is the name of the agency? N/A  Has the agency set up a time to come to the patient's home? not applicable Were any new equipment or medical supplies ordered?  Yes: Tub bench, Crutches and Wheelchair What is the name of the medical supply agency? [employer] Were you able to get the supplies/equipment? yes Do you have any questions related to the use of the equipment or supplies? No  Functional Questionnaire: (I = Independent and D = Dependent) ADLs: D  Bathing/Dressing- D  Meal Prep- D  Eating- I  Maintaining continence- I  Transferring/Ambulation- D  Managing Meds- I  Follow up appointments reviewed:   PCP Hospital f/u appt confirmed? No   Specialist Hospital f/u appt confirmed? No    Are transportation arrangements needed? No   If their condition worsens, is the pt aware to call PCP or go to the Emergency Dept.? Yes  Was the patient provided with contact information for the PCP's office or ED? Yes  Was to pt encouraged to call back with questions or concerns? Yes

## 2020-08-21 ENCOUNTER — Telehealth: Payer: Self-pay

## 2020-08-21 MED ORDER — OXYCODONE HCL 5 MG PO TABS: 5.0000 mg | ORAL_TABLET | Freq: Four times a day (QID) | ORAL | 0 refills | Status: DC | PRN

## 2020-08-21 NOTE — Telephone Encounter (Signed)
Pt called requesting a refill on her oxycodone.  Pt set up a apt for next Monday to follow up from surgery.

## 2020-08-21 NOTE — Telephone Encounter (Signed)
08/12/20 ORIF Please advise

## 2020-08-24 ENCOUNTER — Telehealth: Payer: Self-pay | Admitting: Orthopaedic Surgery

## 2020-08-24 NOTE — Telephone Encounter (Signed)
Note written

## 2020-08-24 NOTE — Telephone Encounter (Signed)
Patient called stating she needs a note for program assistance. She need the note to say why she is out of work and for how long. Patient is asking for note to be sent to her mychart. Patient phone number is [phone].

## 2020-08-28 ENCOUNTER — Ambulatory Visit (INDEPENDENT_AMBULATORY_CARE_PROVIDER_SITE_OTHER): Payer: Medicaid Other | Admitting: Orthopaedic Surgery

## 2020-08-28 ENCOUNTER — Ambulatory Visit (INDEPENDENT_AMBULATORY_CARE_PROVIDER_SITE_OTHER): Payer: Medicaid Other

## 2020-08-28 ENCOUNTER — Encounter: Payer: Self-pay | Admitting: Orthopaedic Surgery

## 2020-08-28 DIAGNOSIS — Z8781 Personal history of (healed) traumatic fracture: Secondary | ICD-10-CM | POA: Diagnosis not present

## 2020-08-28 DIAGNOSIS — Z9889 Other specified postprocedural states: Secondary | ICD-10-CM | POA: Diagnosis not present

## 2020-08-28 DIAGNOSIS — S72001A Fracture of unspecified part of neck of right femur, initial encounter for closed fracture: Secondary | ICD-10-CM

## 2020-08-28 MED ORDER — OXYCODONE HCL 5 MG PO TABS: 5.0000 mg | ORAL_TABLET | Freq: Four times a day (QID) | ORAL | 0 refills | Status: DC | PRN
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
## 2020-09-05 DIAGNOSIS — Z5181 Encounter for therapeutic drug level monitoring: Secondary | ICD-10-CM | POA: Diagnosis not present

## 2020-09-07 DIAGNOSIS — N898 Other specified noninflammatory disorders of vagina: Secondary | ICD-10-CM | POA: Diagnosis not present

## 2020-09-07 DIAGNOSIS — Z09 Encounter for follow-up examination after completed treatment for conditions other than malignant neoplasm: Secondary | ICD-10-CM | POA: Diagnosis not present

## 2020-09-07 DIAGNOSIS — S7291XD Unspecified fracture of right femur, subsequent encounter for closed fracture with routine healing: Secondary | ICD-10-CM | POA: Diagnosis not present

## 2020-09-13 ENCOUNTER — Other Ambulatory Visit: Payer: Self-pay | Admitting: Orthopaedic Surgery

## 2020-09-13 ENCOUNTER — Telehealth: Payer: Self-pay | Admitting: Orthopaedic Surgery

## 2020-09-13 MED ORDER — OXYCODONE HCL 5 MG PO TABS: 5.0000 mg | ORAL_TABLET | Freq: Four times a day (QID) | ORAL | 0 refills | Status: DC | PRN

## 2020-09-13 NOTE — Telephone Encounter (Signed)
Please advise 

## 2020-09-13 NOTE — Telephone Encounter (Signed)
Patient called. Says she is out of pain medications. Would like a refill called in for her.

## 2020-09-25 ENCOUNTER — Ambulatory Visit (INDEPENDENT_AMBULATORY_CARE_PROVIDER_SITE_OTHER): Payer: Medicaid Other

## 2020-09-25 ENCOUNTER — Encounter: Payer: Self-pay | Admitting: Orthopaedic Surgery

## 2020-09-25 ENCOUNTER — Ambulatory Visit (INDEPENDENT_AMBULATORY_CARE_PROVIDER_SITE_OTHER): Payer: Medicaid Other | Admitting: Orthopaedic Surgery

## 2020-09-25 DIAGNOSIS — Z8781 Personal history of (healed) traumatic fracture: Secondary | ICD-10-CM

## 2020-09-25 DIAGNOSIS — S72001A Fracture of unspecified part of neck of right femur, initial encounter for closed fracture: Secondary | ICD-10-CM

## 2020-09-25 DIAGNOSIS — Z9889 Other specified postprocedural states: Secondary | ICD-10-CM | POA: Diagnosis not present

## 2020-09-25 MED ORDER — METHOCARBAMOL 500 MG PO TABS: 500.0000 mg | ORAL_TABLET | Freq: Four times a day (QID) | ORAL | 1 refills | Status: DC | PRN

## 2020-09-25 MED ORDER — OXYCODONE HCL 5 MG PO TABS: 5.0000 mg | ORAL_TABLET | Freq: Three times a day (TID) | ORAL | 0 refills | Status: DC | PRN
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
## 2020-09-27 ENCOUNTER — Telehealth: Payer: Self-pay | Admitting: Orthopaedic Surgery

## 2020-09-27 ENCOUNTER — Encounter: Payer: Self-pay | Admitting: Orthopaedic Surgery

## 2020-09-27 NOTE — Telephone Encounter (Signed)
Pt called stating she had some questions about her rx that was called in 09/26/20. Pt states only half of her normal quantity was sent in. Pt would like a CB to further discuss sometime today is possible please.   [phone]

## 2020-09-27 NOTE — Telephone Encounter (Signed)
I called and explained to pt that it was because he was trying to wean her off pain meds. She stated that she is in too much pain to come off pain meds. She stated hydrocodone doesn't help her. She would like Dr. [name] to call her.
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
## 2020-10-13 DIAGNOSIS — S46811A Strain of other muscles, fascia and tendons at shoulder and upper arm level, right arm, initial encounter: Secondary | ICD-10-CM | POA: Diagnosis not present

## 2020-10-13 DIAGNOSIS — M542 Cervicalgia: Secondary | ICD-10-CM | POA: Diagnosis not present

## 2020-10-13 DIAGNOSIS — M25551 Pain in right hip: Secondary | ICD-10-CM | POA: Diagnosis not present

## 2020-10-17 DIAGNOSIS — Z5181 Encounter for therapeutic drug level monitoring: Secondary | ICD-10-CM | POA: Diagnosis not present

## 2020-11-20 ENCOUNTER — Ambulatory Visit
Payer: No Typology Code available for payment source | Attending: Family Medicine | Admitting: Rehabilitative and Restorative Service Providers"

## 2021-03-14 ENCOUNTER — Encounter (HOSPITAL_COMMUNITY): Payer: Self-pay | Admitting: Emergency Medicine

## 2021-03-14 ENCOUNTER — Ambulatory Visit (HOSPITAL_COMMUNITY)
Admission: EM | Admit: 2021-03-14 | Discharge: 2021-03-14 | Disposition: A | Discharge status: Home / Self Care | Payer: Medicaid Other | Attending: Physician Assistant | Admitting: Physician Assistant

## 2021-03-14 ENCOUNTER — Other Ambulatory Visit: Payer: Self-pay

## 2021-03-14 DIAGNOSIS — G43009 Migraine without aura, not intractable, without status migrainosus: Secondary | ICD-10-CM | POA: Insufficient documentation

## 2021-03-14 DIAGNOSIS — R112 Nausea with vomiting, unspecified: Secondary | ICD-10-CM | POA: Diagnosis present

## 2021-03-14 DIAGNOSIS — N390 Urinary tract infection, site not specified: Secondary | ICD-10-CM | POA: Insufficient documentation

## 2021-03-14 LAB — POCT URINALYSIS DIPSTICK, ED / UC
Bilirubin Urine: NEGATIVE
Glucose, UA: NEGATIVE mg/dL
Ketones, ur: NEGATIVE mg/dL
Nitrite: NEGATIVE
Protein, ur: 30 mg/dL — AB
Specific Gravity, Urine: 1.025 (ref 1.005–1.030)
Urobilinogen, UA: 0.2 mg/dL (ref 0.0–1.0)
pH: 6 (ref 5.0–8.0)

## 2021-03-14 LAB — POC URINE PREG, ED: Preg Test, Ur: NEGATIVE

## 2021-03-14 MED ORDER — ONDANSETRON 4 MG PO TBDP
ORAL_TABLET | ORAL | Status: AC
  Filled 2021-03-14: qty 1

## 2021-03-14 MED ORDER — ONDANSETRON 4 MG PO TBDP: 4.0000 mg | ORAL_TABLET | Freq: Three times a day (TID) | ORAL | 0 refills | Status: DC | PRN

## 2021-03-14 MED ORDER — KETOROLAC TROMETHAMINE 30 MG/ML IJ SOLN
INTRAMUSCULAR | Status: AC
  Filled 2021-03-14: qty 1

## 2021-03-14 MED ORDER — ONDANSETRON 4 MG PO TBDP
4.0000 mg | ORAL_TABLET | Freq: Once | ORAL | Status: AC
  Administered 2021-03-14: 4 mg via ORAL

## 2021-03-14 MED ORDER — KETOROLAC TROMETHAMINE 30 MG/ML IJ SOLN
30.0000 mg | Freq: Once | INTRAMUSCULAR | Status: AC
  Administered 2021-03-14: 30 mg via INTRAMUSCULAR

## 2021-03-14 MED ORDER — NITROFURANTOIN MONOHYD MACRO 100 MG PO CAPS: 100.0000 mg | ORAL_CAPSULE | Freq: Two times a day (BID) | ORAL | 0 refills | Status: DC

## 2021-03-14 NOTE — ED Triage Notes (Signed)
Pt is present today with a HA and vomiting. Pt sx started last night.  Pt states that she also is having diarrhea and abdominal pain. Pt states sx started over a month ago.

## 2021-03-14 NOTE — ED Provider Notes (Signed)
[facility]    CSN: [id number] Arrival date & time: 03/14/21  [id number]      History   Chief Complaint Chief Complaint  Patient presents with   Headache   Emesis    HPI Patricia [name] is a 20 y.o. female.   Patient presents today with a 12-hour history of recurrent migraine.  Reports headache pain is rated 8 on a 0-10 pain scale, localized to the right temporal region, described as throbbing, worse with certain activities, no alleviating factors identified.  She reports 3 episodes of nausea and vomiting this morning.  Reports current symptoms are similar to previous migraines.  She has not seen a neurologist since she was much younger; denies any recent imaging including MRI.  Denies any recent medication changes.  She has not tried any over-the-counter medication to concerns what upset her stomach.  She denies any recent illness and denies additional symptoms including cough, nasal congestion, sore throat, sneezing.  She does report generalized weakness and feeling poorly but denies any focal weakness, dysarthria, vision changes.  Denies aura associated with migraines.  She has not been on abortive or prophylactic medication in the past.   Past Medical History:  Diagnosis Date   Allergic    Allergic rhinitis    Generalized headaches     Patient Active Problem List   Diagnosis Date Noted   Closed displaced fracture of neck of right femur ([place]) 08/12/2020   Closed right hip fracture ([place]) 08/12/2020   Chlamydia infection 12/17/2018   Bacterial vaginosis 12/17/2018   Possible exposure to STD 06/03/2017   Contraception management 04/07/2016   Episodic tension-type headache, not intractable 02/24/2015   Migraine without aura and without status migrainosus, not intractable 09/19/2014   Migraine 09/05/2014   Depression 10/15/2012   ACNE VULGARIS 12/07/2009   Seasonal allergies 11/28/2006    Past Surgical History:  Procedure Laterality Date   NO PAST SURGERIES     ORIF  FEMUR FRACTURE Right 08/12/2020   Procedure: OPEN REDUCTION INTERNAL FIXATION (ORIF) HIP FRACTURE;  Surgeon: Mcarthur Rossetti, MD;  Location: [facility];  Service: Orthopedics;  Laterality: Right;   TONSILLECTOMY AND ADENOIDECTOMY  2004    OB History   No obstetric history on file.      Home Medications    Prior to Admission medications   Medication Sig Start Date End Date Taking? Authorizing Provider  nitrofurantoin, macrocrystal-monohydrate, (MACROBID) 100 MG capsule Take 1 capsule (100 mg total) by mouth 2 (two) times daily. 03/14/21  Yes Tenecia Ignasiak K, PA-C  ondansetron (ZOFRAN ODT) 4 MG disintegrating tablet Take 1 tablet (4 mg total) by mouth every 8 (eight) hours as needed for nausea or vomiting. 03/14/21  Yes Naman Spychalski K, PA-C  bethanechol (URECHOLINE) 5 MG tablet Take 1 tablet (5 mg total) by mouth 3 (three) times daily. 08/16/20   Pete Pelt, PA-C  dicyclomine (BENTYL) 20 MG tablet Take 1 tablet (20 mg total) by mouth 2 (two) times daily as needed for spasms. Patient taking differently: Take 20 mg by mouth 3 (three) times daily as needed for spasms.  12/04/18   Caccavale, Sophia, PA-C  EPINEPHrine (EPIPEN 2-PAK) 0.3 mg/0.3 mL IJ SOAJ injection Inject 0.3 mLs (0.3 mg total) into the muscle once as needed. Patient taking differently: Inject 0.3 mg into the muscle once as needed (allergic reaction).  07/08/16   Leeanne Rio, MD  fluticasone (FLONASE) 50 MCG/ACT nasal spray Place 1-2 sprays into both nostrils daily for 7 days. 10/16/19  10/23/19  Wieters, Hallie C, PA-C  methocarbamol (ROBAXIN) 500 MG tablet Take 1 tablet (500 mg total) by mouth every 6 (six) hours as needed for muscle spasms. 09/25/20   Mcarthur Rossetti, MD  SODIUM FLUORIDE 5000 PPM 1.1 % PSTE Take by mouth. 11/20/19   [provider]    Family History Family History  Problem Relation Age of Onset   Migraines Mother    Migraines Maternal Grandmother    Migraines Maternal Aunt      Social History Social History   Tobacco Use   Smoking status: Light Smoker    Packs/day: 0.50    Types: Cigarettes   Smokeless tobacco: Never   Tobacco comments:    Mom smokes outside only, pt lives alone currently (09/12/19)  Vaping Use   Vaping Use: Never used  Substance Use Topics   Alcohol use: Yes   Drug use: Yes    Types: Marijuana     Allergies   Pineapple, Penicillins, Penicillins, and Pineapple   Review of Systems Review of Systems  Constitutional:  Positive for activity change. Negative for appetite change, fatigue and fever.  HENT:  Negative for congestion, sinus pressure, sneezing and sore throat.   Eyes:  Negative for photophobia and visual disturbance.  Respiratory:  Negative for cough and shortness of breath.   Cardiovascular:  Negative for chest pain.  Gastrointestinal:  Positive for nausea and vomiting. Negative for abdominal pain and diarrhea.  Musculoskeletal:  Negative for arthralgias and myalgias.  Neurological:  Positive for weakness (generalizesd) and headaches. Negative for dizziness, syncope, facial asymmetry, speech difficulty, light-headedness and numbness.    Physical Exam Triage Vital Signs ED Triage Vitals  Enc Vitals Group     BP 03/14/21 1211 112/60     Pulse Rate 03/14/21 1211 68     Resp 03/14/21 1211 18     Temp 03/14/21 1211 98.4 F (36.9 C)     Temp src --      SpO2 03/14/21 1211 98 %     Weight --      Height --      Head Circumference --      Peak Flow --      Pain Score 03/14/21 1210 10     Pain Loc --      Pain Edu? --      Excl. in [place]? --    No data found.  Updated Vital Signs BP 112/60   Pulse 68   Temp 98.4 F (36.9 C)   Resp 18   SpO2 98%   Visual Acuity Right Eye Distance:   Left Eye Distance:   Bilateral Distance:    Right Eye Near:   Left Eye Near:    Bilateral Near:     Physical Exam Vitals reviewed.  Constitutional:      General: She is awake. She is not in acute distress.     Appearance: Normal appearance. She is well-developed. She is not ill-appearing.     Comments: Very pleasant female appears stated age in no acute distress sitting comfortably in exam room  HENT:     Head: Normocephalic and atraumatic. No raccoon eyes, Battle's sign or contusion.     Right Ear: Tympanic membrane, ear canal and external ear normal. No hemotympanum.     Left Ear: Tympanic membrane, ear canal and external ear normal. No hemotympanum.     Nose: Nose normal.     Mouth/Throat:     Tongue: Tongue does not deviate from  midline.     Pharynx: Uvula midline. No oropharyngeal exudate or posterior oropharyngeal erythema.  Eyes:     Extraocular Movements: Extraocular movements intact.     Pupils: Pupils are equal, round, and reactive to light.  Cardiovascular:     Rate and Rhythm: Normal rate and regular rhythm.     Heart sounds: Normal heart sounds, S1 normal and S2 normal. No murmur heard. Pulmonary:     Effort: Pulmonary effort is normal.     Breath sounds: Normal breath sounds. No wheezing, rhonchi or rales.     Comments: Clear to auscultation bilaterally Abdominal:     General: Bowel sounds are normal.     Palpations: Abdomen is soft.     Tenderness: There is no abdominal tenderness.  Musculoskeletal:     Cervical back: Normal range of motion and neck supple. No spinous process tenderness or muscular tenderness.     Comments: Strength 5/5 in bilateral upper and lower extremities.  Neurological:     General: No focal deficit present.     Motor: Motor function is intact.     Coordination: Coordination is intact.     Gait: Gait is intact.  Psychiatric:        Behavior: Behavior is cooperative.     UC Treatments / Results  Labs (all labs ordered are listed, but only abnormal results are displayed) Labs Reviewed  POCT URINALYSIS DIPSTICK, ED / UC - Abnormal; Notable for the following components:      Result Value   Hgb urine dipstick TRACE (*)    Protein, ur 30 (*)     Leukocytes,Ua MODERATE (*)    All other components within normal limits  URINE CULTURE  POC URINE PREG, ED    EKG   Radiology No results found.  Procedures Procedures (including critical care time)  Medications Ordered in UC Medications  ondansetron (ZOFRAN-ODT) disintegrating tablet 4 mg (4 mg Oral Given 03/14/21 1313)  ketorolac (TORADOL) 30 MG/ML injection 30 mg (30 mg Intramuscular Given 03/14/21 1313)    Initial Impression / Assessment and Plan / UC Course  I have reviewed the triage vital signs and the nursing notes.  Pertinent labs & imaging results that were available during my care of the patient were reviewed by me and considered in my medical decision making (see chart for details).     Urine pregnancy test was negative.  UA did not show significant leukocyte esterase with hemoglobin.  Upon further questioning patient did report some UTI symptoms.  We will treat with Macrobid twice daily and send this off for culture.  We discussed potential need to change antibiotics based on culture results.  She was given Zofran in clinic with resolution of nausea.  She was sent home with prescription with instruction to use this regularly for the next 24 hours in order to increase oral intake including pushing fluids and small frequent meals.  She was given Toradol injection in clinic today with improvement of headache pain.  Recommended she use over-the-counter medications including Tylenol for pain relief; was instructed to use NSAIDs after 24 hours from Toradol injection.  Discussed alarm symptoms that warrant emergent evaluation.  She return precautions given to which she expressed understanding.  Final Clinical Impressions(s) / UC Diagnoses   Final diagnoses:  Migraine without aura and without status migrainosus, not intractable  Nausea and vomiting, unspecified vomiting type  Lower urinary tract infectious disease     Discharge Instructions      Start Macrobid twice  daily  to cover for urinary tract infection.  Take Zofran 3 times a day as needed for nausea.  Make sure you are drinking plenty fluid and eating small frequent meals.  Use over-the-counter medications including Tylenol for headache.  If you have any worsening symptoms you need to be reevaluated.     ED Prescriptions     Medication Sig Dispense Auth. Provider   nitrofurantoin, macrocrystal-monohydrate, (MACROBID) 100 MG capsule Take 1 capsule (100 mg total) by mouth 2 (two) times daily. 10 capsule Virgie Chery K, PA-C   ondansetron (ZOFRAN ODT) 4 MG disintegrating tablet Take 1 tablet (4 mg total) by mouth every 8 (eight) hours as needed for nausea or vomiting. 20 tablet Jaylena Holloway, Derry Skill, PA-C      PDMP not reviewed this encounter.   Terrilee Croak, PA-C 03/14/21 1343

## 2021-03-14 NOTE — Discharge Instructions (Signed)
Start Macrobid twice daily to cover for urinary tract infection.  Take Zofran 3 times a day as needed for nausea.  Make sure you are drinking plenty fluid and eating small frequent meals.  Use over-the-counter medications including Tylenol for headache.  If you have any worsening symptoms you need to be reevaluated.

## 2021-03-16 ENCOUNTER — Telehealth (HOSPITAL_COMMUNITY): Payer: Self-pay | Admitting: Emergency Medicine

## 2021-03-16 LAB — URINE CULTURE: Culture: >=100000 — AB

## 2021-03-16 MED ORDER — SULFAMETHOXAZOLE-TRIMETHOPRIM 800-160 MG PO TABS: 1.0000 | ORAL_TABLET | Freq: Two times a day (BID) | ORAL | 0 refills | Status: AC

## 2021-03-26 ENCOUNTER — Encounter (HOSPITAL_BASED_OUTPATIENT_CLINIC_OR_DEPARTMENT_OTHER): Payer: Self-pay | Admitting: *Deleted

## 2021-03-26 ENCOUNTER — Emergency Department (HOSPITAL_BASED_OUTPATIENT_CLINIC_OR_DEPARTMENT_OTHER)
Admission: EM | Admit: 2021-03-26 | Discharge: 2021-03-26 | Disposition: A | Discharge status: Home / Self Care | Payer: Medicaid Other | Attending: Student | Admitting: Student

## 2021-03-26 ENCOUNTER — Other Ambulatory Visit: Payer: Self-pay

## 2021-03-26 DIAGNOSIS — R0981 Nasal congestion: Secondary | ICD-10-CM | POA: Insufficient documentation

## 2021-03-26 DIAGNOSIS — R197 Diarrhea, unspecified: Secondary | ICD-10-CM | POA: Insufficient documentation

## 2021-03-26 DIAGNOSIS — F17210 Nicotine dependence, cigarettes, uncomplicated: Secondary | ICD-10-CM | POA: Insufficient documentation

## 2021-03-26 DIAGNOSIS — R509 Fever, unspecified: Secondary | ICD-10-CM | POA: Insufficient documentation

## 2021-03-26 DIAGNOSIS — Z20822 Contact with and (suspected) exposure to covid-19: Secondary | ICD-10-CM | POA: Diagnosis not present

## 2021-03-26 DIAGNOSIS — R051 Acute cough: Secondary | ICD-10-CM

## 2021-03-26 LAB — RESP PANEL BY RT-PCR (FLU A&B, COVID) ARPGX2
Influenza A by PCR: NEGATIVE
Influenza B by PCR: NEGATIVE
SARS Coronavirus 2 by RT PCR: NEGATIVE

## 2021-03-26 MED ORDER — BENZONATATE 100 MG PO CAPS: 100.0000 mg | ORAL_CAPSULE | Freq: Three times a day (TID) | ORAL | 0 refills | Status: DC

## 2021-03-26 NOTE — ED Triage Notes (Signed)
C/o fever, h/a , cough x 3 days

## 2021-03-26 NOTE — ED Provider Notes (Signed)
[facility] EMERGENCY DEPARTMENT Provider Note   CSN: [id number] Arrival date & time: 03/26/21  1643     History Chief Complaint  Patient presents with   Fever    Patricia [name] is a 20 y.o. female who presents the emergency department with a 3-day history of cough and subjective fever.  She describes her cough as sharp but nonproductive.  She been taking NyQuil and doing herbal supplements which have offered some improvement.  She denies any sick contacts.  She does work in a [employer].  She denies any chest pain, shortness of breath, nausea, vomiting, abdominal pain.  She does report associated diarrhea and nasal congestion.  She rates her cough moderate severity.  It is improving.   Fever     Past Medical History:  Diagnosis Date   Allergic    Allergic rhinitis    Generalized headaches     Patient Active Problem List   Diagnosis Date Noted   Closed displaced fracture of neck of right femur (HCC) 08/12/2020   Closed right hip fracture (HCC) 08/12/2020   Chlamydia infection 12/17/2018   Bacterial vaginosis 12/17/2018   Possible exposure to STD 06/03/2017   Contraception management 04/07/2016   Episodic tension-type headache, not intractable 02/24/2015   Migraine without aura and without status migrainosus, not intractable 09/19/2014   Migraine 09/05/2014   Depression 10/15/2012   ACNE VULGARIS 12/07/2009   Seasonal allergies 11/28/2006    Past Surgical History:  Procedure Laterality Date   NO PAST SURGERIES     ORIF FEMUR FRACTURE Right 08/12/2020   Procedure: OPEN REDUCTION INTERNAL FIXATION (ORIF) HIP FRACTURE;  Surgeon: Kathryne Hitch, MD;  Location: [facility] OR;  Service: Orthopedics;  Laterality: Right;   TONSILLECTOMY AND ADENOIDECTOMY  2004     OB History   No obstetric history on file.     Family History  Problem Relation Age of Onset   Migraines Mother    Migraines Maternal Grandmother    Migraines Maternal Aunt     Social History    Tobacco Use   Smoking status: Light Smoker    Packs/day: 0.50    Types: Cigarettes   Smokeless tobacco: Never   Tobacco comments:    Mom smokes outside only, pt lives alone currently (09/12/19)  Vaping Use   Vaping Use: Never used  Substance Use Topics   Alcohol use: Yes   Drug use: Yes    Types: Marijuana    Home Medications Prior to Admission medications   Medication Sig Start Date End Date Taking? Authorizing Provider  benzonatate (TESSALON) 100 MG capsule Take 1 capsule (100 mg total) by mouth every 8 (eight) hours. 03/26/21  Yes Meredeth Ide, Rasmus Preusser M, PA-C  bethanechol (URECHOLINE) 5 MG tablet Take 1 tablet (5 mg total) by mouth 3 (three) times daily. 08/16/20   Kirtland Bouchard, PA-C  dicyclomine (BENTYL) 20 MG tablet Take 1 tablet (20 mg total) by mouth 2 (two) times daily as needed for spasms. Patient taking differently: Take 20 mg by mouth 3 (three) times daily as needed for spasms.  12/04/18   Caccavale, Sophia, PA-C  EPINEPHrine (EPIPEN 2-PAK) 0.3 mg/0.3 mL IJ SOAJ injection Inject 0.3 mLs (0.3 mg total) into the muscle once as needed. Patient taking differently: Inject 0.3 mg into the muscle once as needed (allergic reaction).  07/08/16   Latrelle Dodrill, MD  fluticasone (FLONASE) 50 MCG/ACT nasal spray Place 1-2 sprays into both nostrils daily for 7 days. 10/16/19 10/23/19  Wieters, Junius Creamer,  PA-C  methocarbamol (ROBAXIN) 500 MG tablet Take 1 tablet (500 mg total) by mouth every 6 (six) hours as needed for muscle spasms. 09/25/20   Kathryne Hitch, MD  nitrofurantoin, macrocrystal-monohydrate, (MACROBID) 100 MG capsule Take 1 capsule (100 mg total) by mouth 2 (two) times daily. 03/14/21   Raspet, Erin K, PA-C  ondansetron (ZOFRAN ODT) 4 MG disintegrating tablet Take 1 tablet (4 mg total) by mouth every 8 (eight) hours as needed for nausea or vomiting. 03/14/21   Raspet, Erin K, PA-C  SODIUM FLUORIDE 5000 PPM 1.1 % PSTE Take by mouth. 11/20/19   [provider]     Allergies    Pineapple, Penicillins, Penicillins, and Pineapple  Review of Systems   Review of Systems  Constitutional:  Positive for fever.  All other systems reviewed and are negative.  Physical Exam Updated Vital Signs BP 102/70 (BP Location: Left Arm)   Pulse 80   Temp 98.5 F (36.9 C) (Oral)   Resp 16   Ht 5\' 4"  (1.626 m)   Wt 53.1 kg   LMP 03/26/2021   SpO2 100%   BMI 20.08 kg/m   Physical Exam Vitals and nursing note reviewed.  Constitutional:      General: She is not in acute distress.    Appearance: Normal appearance.  HENT:     Head: Normocephalic and atraumatic.  Eyes:     General:        Right eye: No discharge.        Left eye: No discharge.  Cardiovascular:     Comments: Regular rate and rhythm.  S1/S2 are distinct without any evidence of murmur, rubs, or gallops.  Radial pulses are 2+ bilaterally.  Dorsalis pedis pulses are 2+ bilaterally.  No evidence of pedal edema. Pulmonary:     Comments: Clear to auscultation bilaterally.  Normal effort.  No respiratory distress.  No evidence of wheezes, rales, or rhonchi heard throughout. Abdominal:     General: Abdomen is flat. Bowel sounds are normal. There is no distension.     Tenderness: There is no abdominal tenderness. There is no guarding or rebound.  Musculoskeletal:        General: Normal range of motion.     Cervical back: Neck supple.  Skin:    General: Skin is warm and dry.     Findings: No rash.  Neurological:     General: No focal deficit present.     Mental Status: She is alert.  Psychiatric:        Mood and Affect: Mood normal.        Behavior: Behavior normal.    ED Results / Procedures / Treatments   Labs (all labs ordered are listed, but only abnormal results are displayed) Labs Reviewed  RESP PANEL BY RT-PCR (FLU A&B, COVID) ARPGX2    EKG None  Radiology No results found.  Procedures Procedures   Medications Ordered in ED Medications - No data to display  ED  Course  I have reviewed the triage vital signs and the nursing notes.  Pertinent labs & imaging results that were available during my care of the patient were reviewed by me and considered in my medical decision making (see chart for details).    MDM Rules/Calculators/A&P                          Lesieli Bresee is a 20 y.o. female who presents the emergency department for for evaluation  of cough and URI symptoms.  COVID and influenza were negative today.  I still believe this is likely a viral illness.  She is currently improving since onset.  She is in no respiratory distress at this time and her vitals have been normal during her entire ED visit.  Strict return precautions were given.  I will write her for Tessalon pearls for cough.  All questions and concerns addressed.  She is safe for discharge.  Final Clinical Impression(s) / ED Diagnoses Final diagnoses:  Acute cough    Rx / DC Orders ED Discharge Orders          Ordered    benzonatate (TESSALON) 100 MG capsule  Every 8 hours        03/26/21 1847             Teressa Lower, PA-C 03/26/21 1853    Glendora Score, MD 03/27/21 [phone]

## 2021-03-27 ENCOUNTER — Telehealth: Payer: Self-pay

## 2021-03-27 NOTE — Telephone Encounter (Signed)
Transition Care Management Unsuccessful Follow-up Telephone Call  Date of discharge and from where:  03/26/2021 from [facility]  Attempts:  1st Attempt  Reason for unsuccessful TCM follow-up call:  Unable to leave message

## 2021-03-28 NOTE — Telephone Encounter (Signed)
Transition Care Management Unsuccessful Follow-up Telephone Call  Date of discharge and from where:  03/26/2021 from [facility]  Attempts:  2nd Attempt  Reason for unsuccessful TCM follow-up call:  Unable to leave message

## 2021-03-30 NOTE — Telephone Encounter (Signed)
Transition Care Management Unsuccessful Follow-up Telephone Call  Date of discharge and from where:  03/26/2021-[facility]  Attempts:  3rd Attempt  Reason for unsuccessful TCM follow-up call:  Unable to leave message

## 2021-04-24 ENCOUNTER — Other Ambulatory Visit: Payer: Self-pay

## 2021-04-24 ENCOUNTER — Emergency Department (HOSPITAL_BASED_OUTPATIENT_CLINIC_OR_DEPARTMENT_OTHER)
Admission: EM | Admit: 2021-04-24 | Discharge: 2021-04-24 | Disposition: A | Discharge status: Home / Self Care | Payer: Medicaid Other | Attending: Emergency Medicine | Admitting: Emergency Medicine

## 2021-04-24 ENCOUNTER — Encounter (HOSPITAL_BASED_OUTPATIENT_CLINIC_OR_DEPARTMENT_OTHER): Payer: Self-pay

## 2021-04-24 DIAGNOSIS — F1099 Alcohol use, unspecified with unspecified alcohol-induced disorder: Secondary | ICD-10-CM | POA: Insufficient documentation

## 2021-04-24 DIAGNOSIS — F17210 Nicotine dependence, cigarettes, uncomplicated: Secondary | ICD-10-CM | POA: Insufficient documentation

## 2021-04-24 DIAGNOSIS — Z20822 Contact with and (suspected) exposure to covid-19: Secondary | ICD-10-CM | POA: Insufficient documentation

## 2021-04-24 DIAGNOSIS — R112 Nausea with vomiting, unspecified: Secondary | ICD-10-CM | POA: Diagnosis present

## 2021-04-24 DIAGNOSIS — Z789 Other specified health status: Secondary | ICD-10-CM

## 2021-04-24 DIAGNOSIS — N390 Urinary tract infection, site not specified: Secondary | ICD-10-CM | POA: Diagnosis not present

## 2021-04-24 DIAGNOSIS — F1090 Alcohol use, unspecified, uncomplicated: Secondary | ICD-10-CM

## 2021-04-24 LAB — COMPREHENSIVE METABOLIC PANEL
ALT: 23 U/L (ref 0–44)
AST: 33 U/L (ref 15–41)
Albumin: 4.6 g/dL (ref 3.5–5.0)
Alkaline Phosphatase: 57 U/L (ref 38–126)
Anion gap: 9 (ref 5–15)
BUN: 9 mg/dL (ref 6–20)
CO2: 26 mmol/L (ref 22–32)
Calcium: 8.8 mg/dL — ABNORMAL LOW (ref 8.9–10.3)
Chloride: 104 mmol/L (ref 98–111)
Creatinine, Ser: 0.76 mg/dL (ref 0.44–1.00)
GFR, Estimated: >60 mL/min (ref >60)
Glucose, Bld: 119 mg/dL — ABNORMAL HIGH (ref 70–99)
Potassium: 3.7 mmol/L (ref 3.5–5.1)
Sodium: 139 mmol/L (ref 135–145)
Total Bilirubin: 0.6 mg/dL (ref 0.3–1.2)
Total Protein: 8 g/dL (ref 6.5–8.1)

## 2021-04-24 LAB — URINALYSIS, ROUTINE W REFLEX MICROSCOPIC
Bilirubin Urine: NEGATIVE
Glucose, UA: NEGATIVE mg/dL
Ketones, ur: NEGATIVE mg/dL
Nitrite: NEGATIVE
Protein, ur: NEGATIVE mg/dL
Specific Gravity, Urine: 1.02 (ref 1.005–1.030)
pH: 7.5 (ref 5.0–8.0)

## 2021-04-24 LAB — URINALYSIS, MICROSCOPIC (REFLEX)

## 2021-04-24 LAB — CBC
HCT: 42.6 % (ref 36.0–46.0)
Hemoglobin: 14.2 g/dL (ref 12.0–15.0)
MCH: 31.6 pg (ref 26.0–34.0)
MCHC: 33.3 g/dL (ref 30.0–36.0)
MCV: 94.9 fL (ref 80.0–100.0)
Platelets: 284 10*3/uL (ref 150–400)
RBC: 4.49 MIL/uL (ref 3.87–5.11)
RDW: 13 % (ref 11.5–15.5)
WBC: 11 10*3/uL — ABNORMAL HIGH (ref 4.0–10.5)
nRBC: 0 % (ref 0.0–0.2)

## 2021-04-24 LAB — PREGNANCY, URINE: Preg Test, Ur: NEGATIVE

## 2021-04-24 LAB — RESP PANEL BY RT-PCR (FLU A&B, COVID) ARPGX2
Influenza A by PCR: NEGATIVE
Influenza B by PCR: NEGATIVE
SARS Coronavirus 2 by RT PCR: NEGATIVE

## 2021-04-24 LAB — LIPASE, BLOOD: Lipase: 24 U/L (ref 11–51)

## 2021-04-24 MED ORDER — FAMOTIDINE IN NACL 20-0.9 MG/50ML-% IV SOLN
20.0000 mg | Freq: Once | INTRAVENOUS | Status: AC
  Administered 2021-04-24: 22:00:00 20 mg via INTRAVENOUS
  Filled 2021-04-24: qty 50

## 2021-04-24 MED ORDER — ONDANSETRON HCL 4 MG PO TABS: 4.0000 mg | ORAL_TABLET | ORAL | 0 refills | Status: AC | PRN

## 2021-04-24 MED ORDER — CEPHALEXIN 500 MG PO CAPS: 500.0000 mg | ORAL_CAPSULE | Freq: Two times a day (BID) | ORAL | 0 refills | Status: DC

## 2021-04-24 MED ORDER — SODIUM CHLORIDE 0.9 % IV BOLUS: 2000.0000 mL | Freq: Once | INTRAVENOUS | Status: DC

## 2021-04-24 MED ORDER — ONDANSETRON 4 MG PO TBDP
4.0000 mg | ORAL_TABLET | Freq: Once | ORAL | Status: AC | PRN
  Administered 2021-04-24: 19:00:00 4 mg via ORAL
  Filled 2021-04-24: qty 1

## 2021-04-24 MED ORDER — ONDANSETRON HCL 4 MG/2ML IJ SOLN
4.0000 mg | Freq: Once | INTRAMUSCULAR | Status: AC
  Administered 2021-04-24: 22:00:00 4 mg via INTRAVENOUS
  Filled 2021-04-24: qty 2

## 2021-04-24 MED ORDER — SUCRALFATE 1 G PO TABS: 1.0000 g | ORAL_TABLET | Freq: Three times a day (TID) | ORAL | 0 refills | Status: AC

## 2021-04-24 MED ORDER — SODIUM CHLORIDE 0.9 % IV BOLUS
1000.0000 mL | Freq: Once | INTRAVENOUS | Status: AC
  Administered 2021-04-24: 22:00:00 1000 mL via INTRAVENOUS

## 2021-04-24 NOTE — ED Triage Notes (Signed)
Pt c/o n/v/d and constipation-started this am after drinking large amount of ETOH yesterday-NAD-steady gait-pt entered triage vomiting

## 2021-04-24 NOTE — ED Notes (Signed)
E-SIGN NOT WORKING IN TRIAGE-PT VERBALLY AGREED TO MSE STATEMENT

## 2021-04-24 NOTE — Discharge Instructions (Addendum)
Please avoid alcohol use in the future.

## 2021-04-24 NOTE — ED Notes (Signed)
Pt was given ginger ale ~30 minutes ago. Pt tolerating PO intake at this time.

## 2021-04-24 NOTE — ED Provider Notes (Addendum)
[facility] EMERGENCY DEPARTMENT Provider Note   CSN: [id number] Arrival date & time: 04/24/21  1859     History Chief Complaint  Patient presents with   Vomiting    Patricia [name] is a 20 y.o. female.  This is a 20 y.o. female with significant medical history as below, including migraine, depression who presents to the ED with complaint of nausea, vomiting abdominal pain.  Patient reports that she drank in excess last night of alcohol.  She is not a daily drinker.  No history of alcohol drawl or seizure in the past.  Patient with significant nausea and vomiting since waking up this afternoon.  She has been able to tolerate limited oral intake.  She reports that smells of foods make her feel nauseated and she vomits.  No fevers, chills, known sick contacts or recent travel.  No suspicious oral intake.  No medications prior to arrival.  Normal state of health prior to onset of symptoms.  No history of abdominal surgery, no change to bowel or bladder function.  She does have some mild discomfort to her epigastric area that precedes her emesis described as a cramping sensation.  No illicit drug use.  No chest pain, dyspnea, numbness or tingling.  No rashes.  No other acute medical complaints offered  The history is provided by the patient. No language interpreter was used.      Past Medical History:  Diagnosis Date   Allergic    Allergic rhinitis    Generalized headaches     Patient Active Problem List   Diagnosis Date Noted   Closed displaced fracture of neck of right femur ([place]) 08/12/2020   Closed right hip fracture ([place]) 08/12/2020   Chlamydia infection 12/17/2018   Bacterial vaginosis 12/17/2018   Possible exposure to STD 06/03/2017   Contraception management 04/07/2016   Episodic tension-type headache, not intractable 02/24/2015   Migraine without aura and without status migrainosus, not intractable 09/19/2014   Migraine 09/05/2014   Depression 10/15/2012   ACNE  VULGARIS 12/07/2009   Seasonal allergies 11/28/2006    Past Surgical History:  Procedure Laterality Date   NO PAST SURGERIES     ORIF FEMUR FRACTURE Right 08/12/2020   Procedure: OPEN REDUCTION INTERNAL FIXATION (ORIF) HIP FRACTURE;  Surgeon: Mcarthur Rossetti, MD;  Location: [facility];  Service: Orthopedics;  Laterality: Right;   TONSILLECTOMY AND ADENOIDECTOMY  2004     OB History   No obstetric history on file.     Family History  Problem Relation Age of Onset   Migraines Mother    Migraines Maternal Grandmother    Migraines Maternal Aunt     Social History   Tobacco Use   Smoking status: Every Day    Packs/day: 0.50    Types: Cigarettes   Smokeless tobacco: Never   Tobacco comments:    Mom smokes outside only, pt lives alone currently (09/12/19)  Vaping Use   Vaping Use: Never used  Substance Use Topics   Alcohol use: Yes    Comment: occ   Drug use: Yes    Types: Marijuana    Home Medications Prior to Admission medications   Medication Sig Start Date End Date Taking? Authorizing Provider  cephALEXin (KEFLEX) 500 MG capsule Take 1 capsule (500 mg total) by mouth 2 (two) times daily for 7 days. 04/24/21 05/01/21 Yes Wynona Dove A, DO  ondansetron (ZOFRAN) 4 MG tablet Take 1 tablet (4 mg total) by mouth every 4 (four) hours as needed  for nausea or vomiting. 04/24/21  Yes Wynona Dove A, DO  sucralfate (CARAFATE) 1 g tablet Take 1 tablet (1 g total) by mouth 4 (four) times daily -  with meals and at bedtime for 5 days. 04/24/21 04/29/21 Yes Jeanell Sparrow, DO  benzonatate (TESSALON) 100 MG capsule Take 1 capsule (100 mg total) by mouth every 8 (eight) hours. 03/26/21   Myna Bright M, PA-C  bethanechol (URECHOLINE) 5 MG tablet Take 1 tablet (5 mg total) by mouth 3 (three) times daily. 08/16/20   Pete Pelt, PA-C  dicyclomine (BENTYL) 20 MG tablet Take 1 tablet (20 mg total) by mouth 2 (two) times daily as needed for spasms. Patient taking differently: Take 20  mg by mouth 3 (three) times daily as needed for spasms.  12/04/18   Caccavale, Sophia, PA-C  EPINEPHrine (EPIPEN 2-PAK) 0.3 mg/0.3 mL IJ SOAJ injection Inject 0.3 mLs (0.3 mg total) into the muscle once as needed. Patient taking differently: Inject 0.3 mg into the muscle once as needed (allergic reaction).  07/08/16   Leeanne Rio, MD  fluticasone (FLONASE) 50 MCG/ACT nasal spray Place 1-2 sprays into both nostrils daily for 7 days. 10/16/19 10/23/19  Wieters, Hallie C, PA-C  methocarbamol (ROBAXIN) 500 MG tablet Take 1 tablet (500 mg total) by mouth every 6 (six) hours as needed for muscle spasms. 09/25/20   Mcarthur Rossetti, MD  nitrofurantoin, macrocrystal-monohydrate, (MACROBID) 100 MG capsule Take 1 capsule (100 mg total) by mouth 2 (two) times daily. 03/14/21   Raspet, Erin K, PA-C  ondansetron (ZOFRAN ODT) 4 MG disintegrating tablet Take 1 tablet (4 mg total) by mouth every 8 (eight) hours as needed for nausea or vomiting. 03/14/21   Raspet, Erin K, PA-C  SODIUM FLUORIDE 5000 PPM 1.1 % PSTE Take by mouth. 11/20/19   [provider]    Allergies    Pineapple, Penicillins, Penicillins, and Pineapple  Review of Systems   Review of Systems  Constitutional:  Positive for appetite change. Negative for activity change and fever.  HENT:  Negative for facial swelling and trouble swallowing.   Eyes:  Negative for discharge and redness.  Respiratory:  Negative for cough and shortness of breath.   Cardiovascular:  Negative for chest pain and palpitations.  Gastrointestinal:  Positive for abdominal pain, nausea and vomiting.  Genitourinary:  Negative for dysuria and flank pain.  Musculoskeletal:  Negative for back pain and gait problem.  Skin:  Negative for pallor and rash.  Neurological:  Negative for syncope and headaches.   Physical Exam Updated Vital Signs BP 101/63    Pulse 65    Temp 98.7 F (37.1 C) (Oral)    Resp 16    Ht 5\' 4"  (1.626 m)    Wt 51.7 kg    LMP 04/19/2021     SpO2 99%    BMI 19.57 kg/m   Physical Exam Vitals and nursing note reviewed.  Constitutional:      General: She is not in acute distress.    Appearance: Normal appearance.  HENT:     Head: Normocephalic and atraumatic.     Right Ear: External ear normal.     Left Ear: External ear normal.     Nose: Nose normal.     Mouth/Throat:     Mouth: Mucous membranes are moist.  Eyes:     General: No scleral icterus.       Right eye: No discharge.        Left eye:  No discharge.  Cardiovascular:     Rate and Rhythm: Normal rate and regular rhythm.     Pulses: Normal pulses.     Heart sounds: Normal heart sounds.  Pulmonary:     Effort: Pulmonary effort is normal. No respiratory distress.     Breath sounds: Normal breath sounds.  Abdominal:     General: Abdomen is flat. There is no distension.     Palpations: Abdomen is soft.     Tenderness: There is no abdominal tenderness.     Comments: Soft, not peritoneal, no rebound   Musculoskeletal:        General: Normal range of motion.     Cervical back: Normal range of motion.     Right lower leg: No edema.     Left lower leg: No edema.  Skin:    General: Skin is warm and dry.     Capillary Refill: Capillary refill takes less than 2 seconds.  Neurological:     Mental Status: She is alert and oriented to person, place, and time.     GCS: GCS eye subscore is 4. GCS verbal subscore is 5. GCS motor subscore is 6.  Psychiatric:        Mood and Affect: Mood normal.        Behavior: Behavior normal.    ED Results / Procedures / Treatments   Labs (all labs ordered are listed, but only abnormal results are displayed) Labs Reviewed  COMPREHENSIVE METABOLIC PANEL - Abnormal; Notable for the following components:      Result Value   Glucose, Bld 119 (*)    Calcium 8.8 (*)    All other components within normal limits  CBC - Abnormal; Notable for the following components:   WBC 11.0 (*)    All other components within normal limits   URINALYSIS, ROUTINE W REFLEX MICROSCOPIC - Abnormal; Notable for the following components:   Hgb urine dipstick TRACE (*)    Leukocytes,Ua SMALL (*)    All other components within normal limits  URINALYSIS, MICROSCOPIC (REFLEX) - Abnormal; Notable for the following components:   Bacteria, UA RARE (*)    All other components within normal limits  RESP PANEL BY RT-PCR (FLU A&B, COVID) ARPGX2  URINE CULTURE  LIPASE, BLOOD  PREGNANCY, URINE    EKG None  Radiology No results found.  Procedures Procedures   Medications Ordered in ED Medications  ondansetron (ZOFRAN-ODT) disintegrating tablet 4 mg (4 mg Oral Given 04/24/21 1925)  famotidine (PEPCID) IVPB 20 mg premix (0 mg Intravenous Stopped 04/24/21 2318)  ondansetron (ZOFRAN) injection 4 mg (4 mg Intravenous Given 04/24/21 2221)  sodium chloride 0.9 % bolus 1,000 mL (0 mLs Intravenous Stopped 04/24/21 2318)    ED Course  I have reviewed the triage vital signs and the nursing notes.  Pertinent labs & imaging results that were available during my care of the patient were reviewed by me and considered in my medical decision making (see chart for details).    MDM Rules/Calculators/A&P                          CC: Nausea, vomiting, abdominal pain  This patient complains of above; this involves an extensive number of treatment options and is a complaint that carries with it a high risk of complications and morbidity. Vital signs were reviewed. Serious etiologies considered.  Record review:  Previous records obtained and reviewed   Work up as above, notable for:  Lab results that were available during my care of the patient were reviewed by me and considered in my medical decision making.   Pt with mild evidence of infection on her UA, will send urine culture, Will start on keflex. She has tolerated cephalosporins in the past.   Management: Patient given IV fluids, Zofran, Pepcid.  Reassessment:  Patient does report  feeling substantially better following intervention.  She is tolerating oral intake without difficulty.  Repeat abdominal exam is soft, nontender, nonperitoneal.    Symptoms likely secondary to excessive alcohol consumption versus gastroenteritis/viral syndrome.    Advised patient to refrain from alcohol abuse in the future.  Bland diet, discharged with antiemetics, antacids.  Strict return precautions were discussed.  The patient improved significantly and was discharged in stable condition. Detailed discussions were had with the patient regarding current findings, and need for close f/u with PCP or on call doctor. The patient has been instructed to return immediately if the symptoms worsen in any way for re-evaluation. Patient verbalized understanding and is in agreement with current care plan. All questions answered prior to discharge.            This chart was dictated using voice recognition software.  Despite best efforts to proofread,  errors can occur which can change the documentation meaning.    Final Clinical Impression(s) / ED Diagnoses Final diagnoses:  Nausea and vomiting, unspecified vomiting type  Alcohol use  Urinary tract infection without hematuria, site unspecified    Rx / DC Orders ED Discharge Orders          Ordered    ondansetron (ZOFRAN) 4 MG tablet  Every 4 hours PRN        04/24/21 2258    sucralfate (CARAFATE) 1 g tablet  3 times daily with meals & bedtime        04/24/21 2258    cephALEXin (KEFLEX) 500 MG capsule  2 times daily        04/24/21 2318             Sloan Leiter, DO 04/24/21 2316    Sloan Leiter, DO 04/24/21 2318

## 2021-04-25 ENCOUNTER — Telehealth: Payer: Self-pay

## 2021-04-25 NOTE — Telephone Encounter (Signed)
Transition Care Management Unsuccessful Follow-up Telephone Call  Date of discharge and from where:  04/24/2021-[facility]  Attempts:  1st Attempt  Reason for unsuccessful TCM follow-up call:  Unable to reach patient

## 2021-04-26 LAB — URINE CULTURE: Culture: >=100000 — AB

## 2021-04-26 NOTE — Telephone Encounter (Signed)
Transition Care Management Unsuccessful Follow-up Telephone Call  Date of discharge and from where:  04/24/2021 from [facility]  Attempts:  2nd Attempt  Reason for unsuccessful TCM follow-up call:  Unable to leave message

## 2021-04-27 NOTE — Telephone Encounter (Signed)
Transition Care Management Unsuccessful Follow-up Telephone Call  Date of discharge and from where:  04/24/2021-[facility]  Attempts:  3rd Attempt  Reason for unsuccessful TCM follow-up call:  Unable to leave message

## 2021-04-30 ENCOUNTER — Emergency Department (HOSPITAL_BASED_OUTPATIENT_CLINIC_OR_DEPARTMENT_OTHER): Payer: Medicaid Other

## 2021-04-30 ENCOUNTER — Inpatient Hospital Stay (HOSPITAL_BASED_OUTPATIENT_CLINIC_OR_DEPARTMENT_OTHER)
Admission: EM | Admit: 2021-04-30 | Discharge: 2021-05-03 | DRG: 690 | Disposition: A | Discharge status: Home / Self Care | Payer: Medicaid Other | Attending: Internal Medicine | Admitting: Internal Medicine

## 2021-04-30 ENCOUNTER — Other Ambulatory Visit: Payer: Self-pay

## 2021-04-30 ENCOUNTER — Encounter (HOSPITAL_BASED_OUTPATIENT_CLINIC_OR_DEPARTMENT_OTHER): Payer: Self-pay | Admitting: Emergency Medicine

## 2021-04-30 ENCOUNTER — Emergency Department (HOSPITAL_COMMUNITY): Payer: Medicaid Other

## 2021-04-30 DIAGNOSIS — Z880 Allergy status to penicillin: Secondary | ICD-10-CM

## 2021-04-30 DIAGNOSIS — K921 Melena: Secondary | ICD-10-CM | POA: Diagnosis present

## 2021-04-30 DIAGNOSIS — K648 Other hemorrhoids: Secondary | ICD-10-CM | POA: Diagnosis present

## 2021-04-30 DIAGNOSIS — Z79899 Other long term (current) drug therapy: Secondary | ICD-10-CM | POA: Diagnosis not present

## 2021-04-30 DIAGNOSIS — R109 Unspecified abdominal pain: Secondary | ICD-10-CM | POA: Diagnosis not present

## 2021-04-30 DIAGNOSIS — N12 Tubulo-interstitial nephritis, not specified as acute or chronic: Secondary | ICD-10-CM | POA: Diagnosis present

## 2021-04-30 DIAGNOSIS — F17210 Nicotine dependence, cigarettes, uncomplicated: Secondary | ICD-10-CM | POA: Diagnosis present

## 2021-04-30 DIAGNOSIS — R197 Diarrhea, unspecified: Secondary | ICD-10-CM

## 2021-04-30 DIAGNOSIS — Z91018 Allergy to other foods: Secondary | ICD-10-CM

## 2021-04-30 DIAGNOSIS — R112 Nausea with vomiting, unspecified: Secondary | ICD-10-CM

## 2021-04-30 DIAGNOSIS — E876 Hypokalemia: Secondary | ICD-10-CM

## 2021-04-30 DIAGNOSIS — F17290 Nicotine dependence, other tobacco product, uncomplicated: Secondary | ICD-10-CM | POA: Diagnosis present

## 2021-04-30 DIAGNOSIS — Z8379 Family history of other diseases of the digestive system: Secondary | ICD-10-CM | POA: Diagnosis not present

## 2021-04-30 DIAGNOSIS — Z20822 Contact with and (suspected) exposure to covid-19: Secondary | ICD-10-CM | POA: Diagnosis present

## 2021-04-30 DIAGNOSIS — N390 Urinary tract infection, site not specified: Secondary | ICD-10-CM | POA: Diagnosis not present

## 2021-04-30 DIAGNOSIS — D72829 Elevated white blood cell count, unspecified: Secondary | ICD-10-CM

## 2021-04-30 LAB — CBC WITH DIFFERENTIAL/PLATELET
Abs Immature Granulocytes: 0.48 10*3/uL — ABNORMAL HIGH (ref 0.00–0.07)
Abs Immature Granulocytes: 0.88 10*3/uL — ABNORMAL HIGH (ref 0.00–0.07)
Basophils Absolute: 0.1 10*3/uL (ref 0.0–0.1)
Basophils Absolute: 0.1 10*3/uL (ref 0.0–0.1)
Basophils Relative: 0 %
Basophils Relative: 0 %
Eosinophils Absolute: 0 10*3/uL (ref 0.0–0.5)
Eosinophils Absolute: 0 10*3/uL (ref 0.0–0.5)
Eosinophils Relative: 0 %
Eosinophils Relative: 0 %
HCT: 37.5 % (ref 36.0–46.0)
HCT: 34.8 % — ABNORMAL LOW (ref 36.0–46.0)
Hemoglobin: 13 g/dL (ref 12.0–15.0)
Hemoglobin: 12 g/dL (ref 12.0–15.0)
Immature Granulocytes: 2 %
Immature Granulocytes: 3 %
Lymphocytes Relative: 2 %
Lymphocytes Relative: 3 %
Lymphs Abs: 0.6 10*3/uL — ABNORMAL LOW (ref 0.7–4.0)
Lymphs Abs: 1 10*3/uL (ref 0.7–4.0)
MCH: 31.8 pg (ref 26.0–34.0)
MCH: 32 pg (ref 26.0–34.0)
MCHC: 34.7 g/dL (ref 30.0–36.0)
MCHC: 34.5 g/dL (ref 30.0–36.0)
MCV: 91.7 fL (ref 80.0–100.0)
MCV: 92.8 fL (ref 80.0–100.0)
Monocytes Absolute: 1 10*3/uL (ref 0.1–1.0)
Monocytes Absolute: 1 10*3/uL (ref 0.1–1.0)
Monocytes Relative: 4 %
Monocytes Relative: 4 %
Neutro Abs: 25 10*3/uL — ABNORMAL HIGH (ref 1.7–7.7)
Neutro Abs: 25.5 10*3/uL — ABNORMAL HIGH (ref 1.7–7.7)
Neutrophils Relative %: 92 %
Neutrophils Relative %: 90 %
Platelets: 203 10*3/uL (ref 150–400)
Platelets: 195 10*3/uL (ref 150–400)
RBC: 4.09 MIL/uL (ref 3.87–5.11)
RBC: 3.75 MIL/uL — ABNORMAL LOW (ref 3.87–5.11)
RDW: 12.7 % (ref 11.5–15.5)
RDW: 12.8 % (ref 11.5–15.5)
WBC: 27.5 10*3/uL — ABNORMAL HIGH (ref 4.0–10.5)
WBC: 29.1 10*3/uL — ABNORMAL HIGH (ref 4.0–10.5)
nRBC: 0 % (ref 0.0–0.2)
nRBC: 0 % (ref 0.0–0.2)

## 2021-04-30 LAB — URINALYSIS, ROUTINE W REFLEX MICROSCOPIC
Bilirubin Urine: NEGATIVE
Glucose, UA: NEGATIVE mg/dL
Ketones, ur: 80 mg/dL — AB
Nitrite: NEGATIVE
Protein, ur: NEGATIVE mg/dL
Specific Gravity, Urine: >=1.03 (ref 1.005–1.030)
pH: 6 (ref 5.0–8.0)

## 2021-04-30 LAB — COMPREHENSIVE METABOLIC PANEL
ALT: 11 U/L (ref 0–44)
AST: 13 U/L — ABNORMAL LOW (ref 15–41)
Albumin: 4 g/dL (ref 3.5–5.0)
Alkaline Phosphatase: 51 U/L (ref 38–126)
Anion gap: 10 (ref 5–15)
BUN: 10 mg/dL (ref 6–20)
CO2: 23 mmol/L (ref 22–32)
Calcium: 9 mg/dL (ref 8.9–10.3)
Chloride: 101 mmol/L (ref 98–111)
Creatinine, Ser: 0.66 mg/dL (ref 0.44–1.00)
GFR, Estimated: >60 mL/min (ref >60)
Glucose, Bld: 103 mg/dL — ABNORMAL HIGH (ref 70–99)
Potassium: 2.8 mmol/L — ABNORMAL LOW (ref 3.5–5.1)
Sodium: 134 mmol/L — ABNORMAL LOW (ref 135–145)
Total Bilirubin: 1.9 mg/dL — ABNORMAL HIGH (ref 0.3–1.2)
Total Protein: 6.9 g/dL (ref 6.5–8.1)

## 2021-04-30 LAB — RESP PANEL BY RT-PCR (FLU A&B, COVID) ARPGX2
Influenza A by PCR: NEGATIVE
Influenza B by PCR: NEGATIVE
SARS Coronavirus 2 by RT PCR: NEGATIVE

## 2021-04-30 LAB — LACTIC ACID, PLASMA
Lactic Acid, Venous: 0.9 mmol/L (ref 0.5–1.9)
Lactic Acid, Venous: 1.5 mmol/L (ref 0.5–1.9)

## 2021-04-30 LAB — URINALYSIS, MICROSCOPIC (REFLEX): WBC, UA: >50 WBC/hpf (ref 0–5)

## 2021-04-30 LAB — OCCULT BLOOD X 1 CARD TO LAB, STOOL: Fecal Occult Bld: NEGATIVE

## 2021-04-30 LAB — LIPASE, BLOOD: Lipase: 23 U/L (ref 11–51)

## 2021-04-30 LAB — PREGNANCY, URINE: Preg Test, Ur: NEGATIVE

## 2021-04-30 MED ORDER — METRONIDAZOLE 500 MG/100ML IV SOLN
500.0000 mg | Freq: Two times a day (BID) | INTRAVENOUS | Status: DC
  Administered 2021-04-30 – 2021-05-03 (×6): 500 mg via INTRAVENOUS
  Filled 2021-04-30 (×6): qty 100

## 2021-04-30 MED ORDER — MORPHINE SULFATE (PF) 2 MG/ML IV SOLN
2.0000 mg | Freq: Once | INTRAVENOUS | Status: AC
  Administered 2021-04-30: 11:00:00 2 mg via INTRAVENOUS
  Filled 2021-04-30: qty 1

## 2021-04-30 MED ORDER — PROCHLORPERAZINE EDISYLATE 10 MG/2ML IJ SOLN: 10.0000 mg | Freq: Four times a day (QID) | INTRAMUSCULAR | Status: DC | PRN

## 2021-04-30 MED ORDER — PROMETHAZINE HCL 25 MG/ML IJ SOLN
INTRAMUSCULAR | Status: AC
  Filled 2021-04-30: qty 1

## 2021-04-30 MED ORDER — ACETAMINOPHEN 650 MG RE SUPP: 650.0000 mg | Freq: Four times a day (QID) | RECTAL | Status: DC | PRN

## 2021-04-30 MED ORDER — SODIUM CHLORIDE 0.9 % IV SOLN
2.0000 g | Freq: Every day | INTRAVENOUS | Status: DC
  Administered 2021-05-01 – 2021-05-03 (×3): 2 g via INTRAVENOUS
  Filled 2021-04-30 (×3): qty 20

## 2021-04-30 MED ORDER — MORPHINE SULFATE (PF) 2 MG/ML IV SOLN
2.0000 mg | INTRAVENOUS | Status: DC | PRN
  Administered 2021-05-01 – 2021-05-03 (×6): 2 mg via INTRAVENOUS
  Filled 2021-04-30 (×7): qty 1

## 2021-04-30 MED ORDER — SODIUM CHLORIDE 0.9 % IV BOLUS
1000.0000 mL | Freq: Once | INTRAVENOUS | Status: AC
  Administered 2021-04-30: 11:00:00 1000 mL via INTRAVENOUS

## 2021-04-30 MED ORDER — SODIUM CHLORIDE 0.9 % IV SOLN: INTRAVENOUS | Status: DC | PRN

## 2021-04-30 MED ORDER — ONDANSETRON HCL 4 MG/2ML IJ SOLN
4.0000 mg | Freq: Once | INTRAMUSCULAR | Status: AC
  Administered 2021-04-30: 11:00:00 4 mg via INTRAVENOUS
  Filled 2021-04-30: qty 2

## 2021-04-30 MED ORDER — CIPROFLOXACIN IN D5W 400 MG/200ML IV SOLN: 400.0000 mg | Freq: Once | INTRAVENOUS | Status: DC

## 2021-04-30 MED ORDER — SODIUM CHLORIDE 0.9 % IV SOLN
25.0000 mg | Freq: Once | INTRAVENOUS | Status: AC
  Administered 2021-04-30: 15:00:00 25 mg via INTRAVENOUS
  Filled 2021-04-30: qty 1

## 2021-04-30 MED ORDER — SODIUM CHLORIDE 0.9 % IV SOLN
1.0000 g | Freq: Once | INTRAVENOUS | Status: AC
  Administered 2021-04-30: 15:00:00 1 g via INTRAVENOUS
  Filled 2021-04-30: qty 10

## 2021-04-30 MED ORDER — SODIUM CHLORIDE 0.9% FLUSH
3.0000 mL | Freq: Two times a day (BID) | INTRAVENOUS | Status: DC
  Administered 2021-04-30 – 2021-05-01 (×2): 3 mL via INTRAVENOUS

## 2021-04-30 MED ORDER — ONDANSETRON HCL 4 MG PO TABS
4.0000 mg | ORAL_TABLET | Freq: Four times a day (QID) | ORAL | Status: DC | PRN
  Administered 2021-05-02: 08:00:00 4 mg via ORAL
  Filled 2021-04-30: qty 1

## 2021-04-30 MED ORDER — ACETAMINOPHEN 325 MG PO TABS
650.0000 mg | ORAL_TABLET | Freq: Four times a day (QID) | ORAL | Status: DC | PRN
  Administered 2021-04-30 – 2021-05-01 (×2): 650 mg via ORAL
  Filled 2021-04-30: qty 2

## 2021-04-30 MED ORDER — IOHEXOL 300 MG/ML  SOLN
100.0000 mL | Freq: Once | INTRAMUSCULAR | Status: AC | PRN
  Administered 2021-04-30: 12:00:00 100 mL via INTRAVENOUS

## 2021-04-30 MED ORDER — ONDANSETRON HCL 4 MG/2ML IJ SOLN
4.0000 mg | Freq: Four times a day (QID) | INTRAMUSCULAR | Status: DC | PRN
  Administered 2021-04-30 – 2021-05-02 (×3): 4 mg via INTRAVENOUS
  Filled 2021-04-30 (×3): qty 2

## 2021-04-30 MED ORDER — POTASSIUM CHLORIDE 10 MEQ/100ML IV SOLN
10.0000 meq | INTRAVENOUS | Status: AC
  Administered 2021-04-30 – 2021-05-01 (×6): 10 meq via INTRAVENOUS
  Filled 2021-04-30 (×6): qty 100

## 2021-04-30 NOTE — ED Triage Notes (Addendum)
Lower abdominal with N/V/D since yesterday.  Treated for same 6 days ago.  Denies ETOH use but admits to cannabis.  Pt states she saw blood in the toilet when she had diarrhea.  Pt did NOT pick up prescriptions given on 12/20

## 2021-04-30 NOTE — H&P (Signed)
History and Physical    Patient: Patricia [name] [id number] DOB: 2001/02/28 DOA: 04/30/2021 DOS: the patient was seen and examined on 04/30/2021 PCP: [facility]  Patient coming from: Home  Chief Complaint: abdominal pain, N/V/D, blood stools  HPI:  Patricia [name] is a 20 yo female with PMH allergic rhinitis who presented to [facility] with ongoing abdominal pain, vomiting, and diarrhea. She was also evaluated for similar symptoms on 04/24/2021 and considered to have a possible UTI and was prescribed Keflex although she has not picked it up since evaluated.  She was also considered to have possible alcohol excessive consumption or a viral syndrome.  Due to not feeling well, she presented back for further evaluation.  She has not been able to tolerate a diet due to her ongoing abdominal pain and nausea/vomiting.  She also endorses seeing blood mixed in her stools.  She says her menstrual cycle ended approximately 1 week ago but she feels like she has been noticing blood coming from both her vagina and rectum. She underwent CT abdomen/pelvis on work-up and had no acute abnormalities noted.  The CT did mention that the stomach was incompletely distended and unable to exclude distal wall thickening. Urinalysis was positive for small LE, negative nitrite, greater than 50 WBC, few bacteria.  She was given a dose of Rocephin. Notable labs include: Lipase 23 Na 134, K 2.8, TB 1.9 WBC 29.1, Hgb 12, Hct 34.8, PLTC 195  She endorsed a history of Crohn's disease in her mother who has required surgery.  Her maternal grandmother also has diverticulosis.  She endorses smoking Black and milds twice a day as well as marijuana twice a day, last used marijuana 04/29/21. She endorses social alcohol use drinking tequila approximately twice a week.  She denied any other illicit drug use.  She is transferred from [facility] for further work-up of her subjective abdominal pain, bloody  stools, and concern for UTI as well.   Review of Systems: Review of Systems  Constitutional:  Positive for malaise/fatigue and weight loss.  HENT: Negative.    Eyes: Negative.   Respiratory: Negative.    Cardiovascular: Negative.   Gastrointestinal:  Positive for abdominal pain, blood in stool, diarrhea, nausea and vomiting.  Genitourinary:  Positive for frequency.  Musculoskeletal: Negative.   Skin: Negative.   Neurological: Negative.   Endo/Heme/Allergies: Negative.   Psychiatric/Behavioral: Negative.    Past Medical History:  Diagnosis Date   Allergic    Allergic rhinitis    Generalized headaches    Past Surgical History:  Procedure Laterality Date   NO PAST SURGERIES     ORIF FEMUR FRACTURE Right 08/12/2020   Procedure: OPEN REDUCTION INTERNAL FIXATION (ORIF) HIP FRACTURE;  Surgeon: Mcarthur Rossetti, MD;  Location: [place];  Service: Orthopedics;  Laterality: Right;   TONSILLECTOMY AND ADENOIDECTOMY  2004   Social History:  reports that she has been smoking cigarettes and cigars. She has been smoking an average of .5 packs per day. She has never used smokeless tobacco. She reports current alcohol use. She reports current drug use. Drug: Marijuana.  Allergies  Allergen Reactions   Pineapple Anaphylaxis   Penicillins Hives    As a baby. Per mother: rash   Penicillins Hives    Childhood allergic reaction   Pineapple Hives    Family History  Problem Relation Age of Onset   Migraines Mother    Crohn's disease Mother    Migraines Maternal Grandmother    Diverticulosis  Maternal Grandmother    Migraines Maternal Aunt     Prior to Admission medications   Medication Sig Start Date End Date Taking? Authorizing Provider  cephALEXin (KEFLEX) 500 MG capsule Take 1 capsule (500 mg total) by mouth 2 (two) times daily for 7 days. 04/24/21 05/01/21  Sloan Leiter, DO  ondansetron (ZOFRAN) 4 MG tablet Take 1 tablet (4 mg total) by mouth every 4 (four) hours as needed for  nausea or vomiting. 04/24/21   Tanda Rockers A, DO  sucralfate (CARAFATE) 1 g tablet Take 1 tablet (1 g total) by mouth 4 (four) times daily -  with meals and at bedtime for 5 days. 04/24/21 04/29/21  Sloan Leiter, DO    Physical Exam: Vitals:   04/30/21 1250 04/30/21 1430 04/30/21 1623 04/30/21 1722  BP: 102/67 111/65  100/65  Pulse: (!) 101 (!) 102  93  Resp: 16 (!) 25  20  Temp:   99.9 F (37.7 C) 100.3 F (37.9 C)  TempSrc:   Oral Oral  SpO2: 99% 98%  100%  Weight:      Height:      Physical Exam Exam conducted with a chaperone present.  Constitutional:      Appearance: Normal appearance.     Comments: Thin, fatigued appearing and uncomfortable  HENT:     Head: Normocephalic and atraumatic.     Mouth/Throat:     Mouth: Mucous membranes are moist.  Eyes:     Extraocular Movements: Extraocular movements intact.  Cardiovascular:     Rate and Rhythm: Normal rate and regular rhythm.  Pulmonary:     Effort: Pulmonary effort is normal.     Breath sounds: Normal breath sounds.  Abdominal:     Comments: Thin, soft. TTP throughout, no R/G. BS present  Genitourinary:    Comments: No gross blood appreciated from rectal vault of vaginal orifice Musculoskeletal:        General: Normal range of motion.     Cervical back: Normal range of motion and neck supple.  Skin:    General: Skin is warm and dry.  Neurological:     General: No focal deficit present.     Mental Status: She is alert.  Psychiatric:        Mood and Affect: Mood normal.        Behavior: Behavior normal.     Data Reviewed: CT A/P reviewed, unable to exclude distal wall thickening in the stomach.  Assessment/Plan * Abdominal pain- (present on admission) - Given family history and reports of abdominal pain with bloody stools, differential at this time includes undiagnosed IBD versus possible referred pain from UTI although does not explain bloody stools (given that she cannot delineate if coming from vagina  versus rectum, could also be lingering menstrual cycle clearing) -Check FOBT -Continue Rocephin; add flagyl - check cdiff and GI pathogen panel - GI to be consulted in am  - continue CLD, NPO at [facility] - continue IVF - continue pain and nausea control   Acute lower UTI- (present on admission) - Patient not really describing any significant urinary symptoms however will continue treatment at this time with Rocephin, since will also treat abdominal source -Follow-up urine culture  Hypokalemia - replete as needed    Advance Care Planning:   Code Status: Full Code   Consults: GI  Family Communication: none present  Severity of Illness: The appropriate patient status for this patient is INPATIENT. Inpatient status is judged to be reasonable and  necessary in order to provide the required intensity of service to ensure the patient's safety. The patient's presenting symptoms, physical exam findings, and initial radiographic and laboratory data in the context of their chronic comorbidities is felt to place them at high risk for further clinical deterioration. Furthermore, it is not anticipated that the patient will be medically stable for discharge from the hospital within 2 midnights of admission.   * I certify that at the point of admission it is my clinical judgment that the patient will require inpatient hospital care spanning beyond 2 midnights from the point of admission due to high intensity of service, high risk for further deterioration and high frequency of surveillance required.*  Author: Dwyane Dee 04/30/2021 6:14 PM  For on call review [email or web address].

## 2021-04-30 NOTE — Assessment & Plan Note (Signed)
-   replete as needed 
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
## 2021-04-30 NOTE — ED Notes (Signed)
[facility] CALLED FOR TRANSPORTATION

## 2021-04-30 NOTE — Hospital Course (Signed)
Ms. Stamp is a 20 yo female with PMH allergic rhinitis who presented to [facility] with ongoing abdominal pain, vomiting, and diarrhea. She was also evaluated for similar symptoms on 04/24/2021 and considered to have a possible UTI and was prescribed Keflex although she has not picked it up since evaluated.  She was also considered to have possible alcohol excessive consumption or a viral syndrome.  Due to not feeling well, she presented back for further evaluation.  She has not been able to tolerate a diet due to her ongoing abdominal pain and nausea/vomiting.  She also endorses seeing blood mixed in her stools.  She says her menstrual cycle ended approximately 1 week ago but she feels like she has been noticing blood coming from both her vagina and rectum. She underwent CT abdomen/pelvis on work-up and had no acute abnormalities noted.  The CT did mention that the stomach was incompletely distended and unable to exclude distal wall thickening. Urinalysis was positive for small LE, negative nitrite, greater than 50 WBC, few bacteria.  She was given a dose of Rocephin. Notable labs include: Lipase 23 Na 134, K 2.8, TB 1.9 WBC 29.1, Hgb 12, Hct 34.8, PLTC 195  She endorsed a history of Crohn's disease in her mother who has required surgery.  Her maternal grandmother also has diverticulosis.  She endorses smoking Black and milds twice a day as well as marijuana twice a day, last used marijuana 04/29/21. She endorses social alcohol use drinking tequila approximately twice a week.  She denied any other illicit drug use.  She is transferred from [facility] for further work-up of her subjective abdominal pain, bloody stools, and concern for UTI as well.

## 2021-04-30 NOTE — ED Notes (Signed)
CONSULT CALLED  SPOKE WITH KIM AT [facility]

## 2021-04-30 NOTE — Progress Notes (Signed)
Patient had bowel movement with bright red blood from rectum. Dr. Frederick Peers notified. FOBT test in bathroom for next bowel movement. Will continue to monitor.

## 2021-04-30 NOTE — ED Provider Notes (Addendum)
[facility] EMERGENCY DEPARTMENT Provider Note   CSN: [id number] Arrival date & time: 04/30/21  5366     History Chief Complaint  Patient presents with   Abdominal Pain    Patricia [name] is a 20 y.o. female.  Patient presents ER chief complaint of abdominal pain vomiting diarrhea.  Symptoms ongoing for the past 6 days.  She was seen here 6 days ago evaluated and discharged home.  She was told she had a UTI as well but she has not yet picked up the antibiotics.  She had persistent symptoms of vomiting diarrhea and abdominal pain and presents back to the ER.      Past Medical History:  Diagnosis Date   Allergic    Allergic rhinitis    Generalized headaches     Patient Active Problem List   Diagnosis Date Noted   Pyelonephritis 04/30/2021   Closed displaced fracture of neck of right femur (HCC) 08/12/2020   Closed right hip fracture (HCC) 08/12/2020   Chlamydia infection 12/17/2018   Bacterial vaginosis 12/17/2018   Possible exposure to STD 06/03/2017   Contraception management 04/07/2016   Episodic tension-type headache, not intractable 02/24/2015   Migraine without aura and without status migrainosus, not intractable 09/19/2014   Migraine 09/05/2014   Depression 10/15/2012   ACNE VULGARIS 12/07/2009   Seasonal allergies 11/28/2006    Past Surgical History:  Procedure Laterality Date   NO PAST SURGERIES     ORIF FEMUR FRACTURE Right 08/12/2020   Procedure: OPEN REDUCTION INTERNAL FIXATION (ORIF) HIP FRACTURE;  Surgeon: Kathryne Hitch, MD;  Location: [facility] OR;  Service: Orthopedics;  Laterality: Right;   TONSILLECTOMY AND ADENOIDECTOMY  2004     OB History   No obstetric history on file.     Family History  Problem Relation Age of Onset   Migraines Mother    Migraines Maternal Grandmother    Migraines Maternal Aunt     Social History   Tobacco Use   Smoking status: Every Day    Packs/day: 0.50    Types: Cigarettes   Smokeless tobacco:  Never   Tobacco comments:    Mom smokes outside only, pt lives alone currently (09/12/19)  Vaping Use   Vaping Use: Never used  Substance Use Topics   Alcohol use: Yes    Comment: occ   Drug use: Yes    Types: Marijuana    Home Medications Prior to Admission medications   Medication Sig Start Date End Date Taking? Authorizing Provider  cephALEXin (KEFLEX) 500 MG capsule Take 1 capsule (500 mg total) by mouth 2 (two) times daily for 7 days. 04/24/21 05/01/21  Sloan Leiter, DO  ondansetron (ZOFRAN) 4 MG tablet Take 1 tablet (4 mg total) by mouth every 4 (four) hours as needed for nausea or vomiting. 04/24/21   Tanda Rockers A, DO  sucralfate (CARAFATE) 1 g tablet Take 1 tablet (1 g total) by mouth 4 (four) times daily -  with meals and at bedtime for 5 days. 04/24/21 04/29/21  Sloan Leiter, DO    Allergies    Pineapple, Penicillins, Penicillins, and Pineapple  Review of Systems   Review of Systems  Constitutional:  Negative for fever.  HENT:  Negative for ear pain.   Eyes:  Negative for pain.  Respiratory:  Negative for cough.   Cardiovascular:  Negative for chest pain.  Gastrointestinal:  Positive for abdominal pain.  Genitourinary:  Negative for flank pain.  Musculoskeletal:  Negative for back pain.  Skin:  Negative for rash.  Neurological:  Negative for headaches.   Physical Exam Updated Vital Signs BP 102/67 (BP Location: Right Arm)    Pulse (!) 101    Temp 98.5 F (36.9 C) (Oral)    Resp 16    Ht 5\' 4"  (1.626 m)    Wt 54.4 kg    LMP 04/19/2021 Comment: neg upreg in er today.   SpO2 99%    BMI 20.60 kg/m   Physical Exam Constitutional:      General: She is not in acute distress.    Appearance: Normal appearance.  HENT:     Head: Normocephalic.     Nose: Nose normal.  Eyes:     Extraocular Movements: Extraocular movements intact.  Cardiovascular:     Rate and Rhythm: Normal rate.  Pulmonary:     Effort: Pulmonary effort is normal.  Abdominal:     Tenderness:  There is abdominal tenderness.     Comments: Diffuse nonspecific mid abdominal tenderness.  No guarding or rebound noted.  Musculoskeletal:        General: Normal range of motion.     Cervical back: Normal range of motion.  Neurological:     General: No focal deficit present.     Mental Status: She is alert. Mental status is at baseline.    ED Results / Procedures / Treatments   Labs (all labs ordered are listed, but only abnormal results are displayed) Labs Reviewed  URINALYSIS, ROUTINE W REFLEX MICROSCOPIC - Abnormal; Notable for the following components:      Result Value   Color, Urine AMBER (*)    APPearance HAZY (*)    Hgb urine dipstick MODERATE (*)    Ketones, ur 80 (*)    Leukocytes,Ua SMALL (*)    All other components within normal limits  CBC WITH DIFFERENTIAL/PLATELET - Abnormal; Notable for the following components:   WBC 27.5 (*)    Neutro Abs 25.0 (*)    Lymphs Abs 0.6 (*)    Abs Immature Granulocytes 0.48 (*)    All other components within normal limits  COMPREHENSIVE METABOLIC PANEL - Abnormal; Notable for the following components:   Sodium 134 (*)    Potassium 2.8 (*)    Glucose, Bld 103 (*)    AST 13 (*)    Total Bilirubin 1.9 (*)    All other components within normal limits  URINALYSIS, MICROSCOPIC (REFLEX) - Abnormal; Notable for the following components:   Bacteria, UA FEW (*)    All other components within normal limits  CBC WITH DIFFERENTIAL/PLATELET - Abnormal; Notable for the following components:   WBC 29.1 (*)    RBC 3.75 (*)    HCT 34.8 (*)    Neutro Abs 25.5 (*)    Abs Immature Granulocytes 0.88 (*)    All other components within normal limits  CULTURE, BLOOD (ROUTINE X 2)  CULTURE, BLOOD (ROUTINE X 2)  RESP PANEL BY RT-PCR (FLU A&B, COVID) ARPGX2  PREGNANCY, URINE  LIPASE, BLOOD  LACTIC ACID, PLASMA  LACTIC ACID, PLASMA    EKG None  Radiology CT Abdomen Pelvis W Contrast  Result Date: 04/30/2021 CLINICAL DATA:  Acute lower  abdominal pain, nausea, vomiting, and diarrhea since yesterday, seen for same thing 6 days ago EXAM: CT ABDOMEN AND PELVIS WITH CONTRAST TECHNIQUE: Multidetector CT imaging of the abdomen and pelvis was performed using the standard protocol following bolus administration of intravenous contrast. CONTRAST:  05/02/2021 OMNIPAQUE IOHEXOL 300 MG/ML SOLN IV. Dilute  oral contrast. COMPARISON:  08/12/2020 FINDINGS: Lower chest: Lung bases clear Hepatobiliary: Focal fatty infiltration of liver adjacent to falciform fissure. Gallbladder and liver otherwise normal appearance. Pancreas: Normal appearance Spleen: Normal appearance Adrenals/Urinary Tract: Adrenal glands, kidneys, ureters, and bladder normal appearance Stomach/Bowel: Normal appendix. Stomach incompletely distended, unable to exclude distal wall thickening in this setting. Small bowel loops grossly unremarkable. Colon is unopacified in under distended, no gross abnormality seen. Vascular/Lymphatic: Vascular structures patent.  No adenopathy. Reproductive: Unremarkable uterus and ovaries for age Other: No free air or free fluid. No hernia or definite inflammatory process. Musculoskeletal: Unremarkable IMPRESSION: No definite acute intra-abdominal or intrapelvic abnormalities. Electronically Signed   By: Ulyses Southward M.D.   On: 04/30/2021 12:52   DG Chest Port 1 View  Result Date: 04/30/2021 CLINICAL DATA:  Chest pain, vomiting EXAM: PORTABLE CHEST 1 VIEW COMPARISON:  08/12/2020 FINDINGS: The heart size and mediastinal contours are within normal limits. Both lungs are clear. The visualized skeletal structures are unremarkable. IMPRESSION: No active disease. Electronically Signed   By: Ernie Avena M.D.   On: 04/30/2021 13:26    Procedures Procedures   Medications Ordered in ED Medications  promethazine (PHENERGAN) 25 mg in sodium chloride 0.9 % 50 mL IVPB (has no administration in time range)  ciprofloxacin (CIPRO) IVPB 400 mg (has no administration in  time range)  0.9 %  sodium chloride infusion (has no administration in time range)  sodium chloride 0.9 % bolus 1,000 mL (0 mLs Intravenous Stopped 04/30/21 1412)  ondansetron (ZOFRAN) injection 4 mg (4 mg Intravenous Given 04/30/21 1039)  morphine 2 MG/ML injection 2 mg (2 mg Intravenous Given 04/30/21 1039)  iohexol (OMNIPAQUE) 300 MG/ML solution 100 mL (100 mLs Intravenous Contrast Given 04/30/21 1222)  promethazine (PHENERGAN) 25 MG/ML injection (  Return to [facility]  04/30/21 1451)    ED Course  I have reviewed the triage vital signs and the nursing notes.  Pertinent labs & imaging results that were available during my care of the patient were reviewed by me and considered in my medical decision making (see chart for details).    MDM Rules/Calculators/A&P                         Labs show white count of 27, chemistry shows potassium 2.8 and T bili 1.9.  Urinalysis shows small leuks negative nitrites but WBCs too numerous to count.  Patient given some nausea medicine and pain medication but persistently has episodes of vomiting.  Given elevation of white count CT on pelvis pursued which showed no acute findings to explain her elevated leukocytosis.  Repeat CBC ordered and white count appears very similar but slightly higher at 29.  Given patient has persistent symptoms of vomiting and persistent leukocytosis, hospitalist consulted for evaluation.  Blood cultures and lactic acid ordered.   Final Clinical Impression(s) / ED Diagnoses Final diagnoses:  Leukocytosis  Vomiting and diarrhea  Leukocytosis, unspecified type  Pyelonephritis    Rx / DC Orders ED Discharge Orders     None        Cheryll Cockayne, MD 04/30/21 1441    Cheryll Cockayne, MD 04/30/21 1453

## 2021-04-30 NOTE — Assessment & Plan Note (Addendum)
-   Patient not really describing any significant urinary symptoms however will continue treatment at this time with Rocephin, since will also treat abdominal source -Follow-up urine culture: Group B strep isolated - continue rocephin and will complete 3-5 day course (treating abdominal coverage as well, see above)

## 2021-04-30 NOTE — Assessment & Plan Note (Addendum)
-   Given family history and reports of abdominal pain with bloody stools, differential at this time includes undiagnosed IBD versus possible referred pain from UTI although does not explain bloody stools (given that she cannot delineate if coming from vagina versus rectum, could also be lingering menstrual cycle clearing) -Check FOBT: negative -Continue Rocephin; add flagyl - check cdiff and GI pathogen panel: both negative - starting imodium and simethicone for her diarrhea and gas/bloating respectively  - follow up fecal calprotectin  - continue CLD for now  - GI now following; appreciate assistance.  - Given negative infectious workup so far, with bloody BM and family history of IBD, still concerned for her risk  - continue IVF - continue pain and nausea control

## 2021-05-01 DIAGNOSIS — R109 Unspecified abdominal pain: Secondary | ICD-10-CM | POA: Diagnosis not present

## 2021-05-01 DIAGNOSIS — R112 Nausea with vomiting, unspecified: Secondary | ICD-10-CM

## 2021-05-01 DIAGNOSIS — N390 Urinary tract infection, site not specified: Secondary | ICD-10-CM | POA: Diagnosis not present

## 2021-05-01 LAB — CBC WITH DIFFERENTIAL/PLATELET
Abs Immature Granulocytes: 0.25 10*3/uL — ABNORMAL HIGH (ref 0.00–0.07)
Basophils Absolute: 0.1 10*3/uL (ref 0.0–0.1)
Basophils Relative: 0 %
Eosinophils Absolute: 0.1 10*3/uL (ref 0.0–0.5)
Eosinophils Relative: 0 %
HCT: 37.4 % (ref 36.0–46.0)
Hemoglobin: 12.7 g/dL (ref 12.0–15.0)
Immature Granulocytes: 1 %
Lymphocytes Relative: 7 %
Lymphs Abs: 1.4 10*3/uL (ref 0.7–4.0)
MCH: 32.2 pg (ref 26.0–34.0)
MCHC: 34 g/dL (ref 30.0–36.0)
MCV: 94.7 fL (ref 80.0–100.0)
Monocytes Absolute: 0.9 10*3/uL (ref 0.1–1.0)
Monocytes Relative: 4 %
Neutro Abs: 17.5 10*3/uL — ABNORMAL HIGH (ref 1.7–7.7)
Neutrophils Relative %: 88 %
Platelets: 195 10*3/uL (ref 150–400)
RBC: 3.95 MIL/uL (ref 3.87–5.11)
RDW: 12.8 % (ref 11.5–15.5)
WBC: 20.3 10*3/uL — ABNORMAL HIGH (ref 4.0–10.5)
nRBC: 0 % (ref 0.0–0.2)

## 2021-05-01 LAB — RAPID URINE DRUG SCREEN, HOSP PERFORMED
Amphetamines: NOT DETECTED
Barbiturates: NOT DETECTED
Benzodiazepines: NOT DETECTED
Cocaine: NOT DETECTED
Opiates: NOT DETECTED
Tetrahydrocannabinol: POSITIVE — AB

## 2021-05-01 LAB — GASTROINTESTINAL PANEL BY PCR, STOOL (REPLACES STOOL CULTURE)

## 2021-05-01 LAB — BASIC METABOLIC PANEL
Anion gap: 9 (ref 5–15)
BUN: 10 mg/dL (ref 6–20)
CO2: 22 mmol/L (ref 22–32)
Calcium: 9 mg/dL (ref 8.9–10.3)
Chloride: 106 mmol/L (ref 98–111)
Creatinine, Ser: 0.82 mg/dL (ref 0.44–1.00)
GFR, Estimated: >60 mL/min (ref >60)
Glucose, Bld: 79 mg/dL (ref 70–99)
Potassium: 3.5 mmol/L (ref 3.5–5.1)
Sodium: 137 mmol/L (ref 135–145)

## 2021-05-01 LAB — CORTISOL-AM, BLOOD: Cortisol - AM: 15.1 ug/dL (ref 6.7–22.6)

## 2021-05-01 LAB — C DIFFICILE QUICK SCREEN W PCR REFLEX
C Diff antigen: NEGATIVE
C Diff interpretation: NEGATIVE
C Diff toxin: NEGATIVE

## 2021-05-01 LAB — PROTIME-INR
INR: 1.5 — ABNORMAL HIGH (ref 0.8–1.2)
Prothrombin Time: 18.3 seconds — ABNORMAL HIGH (ref 11.4–15.2)

## 2021-05-01 LAB — MAGNESIUM: Magnesium: 2.3 mg/dL (ref 1.7–2.4)

## 2021-05-01 LAB — PROCALCITONIN: Procalcitonin: 6.3 ng/mL

## 2021-05-01 MED ORDER — LOPERAMIDE HCL 2 MG PO CAPS
4.0000 mg | ORAL_CAPSULE | ORAL | Status: DC | PRN
  Administered 2021-05-01: 14:00:00 4 mg via ORAL
  Filled 2021-05-01: qty 2

## 2021-05-01 MED ORDER — SODIUM CHLORIDE 0.9 % IV SOLN: INTRAVENOUS | Status: DC

## 2021-05-01 MED ORDER — POTASSIUM CHLORIDE CRYS ER 20 MEQ PO TBCR
40.0000 meq | EXTENDED_RELEASE_TABLET | Freq: Once | ORAL | Status: AC
  Administered 2021-05-01: 11:00:00 40 meq via ORAL
  Filled 2021-05-01: qty 2

## 2021-05-01 MED ORDER — SIMETHICONE 80 MG PO CHEW
160.0000 mg | CHEWABLE_TABLET | Freq: Four times a day (QID) | ORAL | Status: DC | PRN
  Administered 2021-05-01: 14:00:00 160 mg via ORAL
  Filled 2021-05-01: qty 2

## 2021-05-01 NOTE — Assessment & Plan Note (Signed)
-   at risk for hyperemesis due to her underlying THC use (BID per patient) - continue nausea control  - see abd pain workup as well

## 2021-05-01 NOTE — Consult Note (Signed)
[facility] Consult  Referring Provider: [facility] hospitalist/Dr. Sabino Gasser Primary Care Physician:  [facility] Primary Gastroenterologist: Althia Forts  Reason for Consultation: Diarrhea, nausea, vomiting  HPI: Patricia [name] is a 20 y.o. female presented to the ER with intractable nausea, vomiting and change in bowel habits. Patient states she was at an ER a week ago with similar symptoms and was prescribed antibiotics which she did not take. She has intermittent constipation, no bowel movement for a few days, and then will have diarrhea. Yesterday she states she saw a few specks of blood in her stool, 1 week prior to that she had a menstrual cycle, and the night prior she had noticed bleeding per vagina. Patient states her mother has Crohn's disease and her grandmother struggles with diverticulitis. She has never had a GI work-up before. She states she smokes marijuana on a daily basis at least twice a day. She drinks alcohol, up to 8 shots of tequila, 3 times a week. She also smokes cigarettes. She lives by herself and is currently not working.  She has acid reflux and heartburn. She denies difficulty swallowing or pain on swallowing. She denies early satiety, loss of appetite, bloating or unintentional weight loss.  Past Medical History:  Diagnosis Date   Allergic    Allergic rhinitis    Generalized headaches     Past Surgical History:  Procedure Laterality Date   NO PAST SURGERIES     ORIF FEMUR FRACTURE Right 08/12/2020   Procedure: OPEN REDUCTION INTERNAL FIXATION (ORIF) HIP FRACTURE;  Surgeon: Mcarthur Rossetti, MD;  Location: [facility];  Service: Orthopedics;  Laterality: Right;   TONSILLECTOMY AND ADENOIDECTOMY  2004    Prior to Admission medications   Medication Sig Start Date End Date Taking? Authorizing Provider  cephALEXin (KEFLEX) 500 MG capsule Take 1 capsule (500 mg total) by mouth 2 (two) times daily for 7 days. 04/24/21  05/01/21  Jeanell Sparrow, DO  ondansetron (ZOFRAN) 4 MG tablet Take 1 tablet (4 mg total) by mouth every 4 (four) hours as needed for nausea or vomiting. 04/24/21   Wynona Dove A, DO  sucralfate (CARAFATE) 1 g tablet Take 1 tablet (1 g total) by mouth 4 (four) times daily -  with meals and at bedtime for 5 days. 04/24/21 04/29/21  Jeanell Sparrow, DO    Current Facility-Administered Medications  Medication Dose Route Frequency Provider Last Rate Last Admin   0.9 %  sodium chloride infusion   Intravenous PRN Dwyane Dee, MD 75 mL/hr at 04/30/21 2347 New Bag at 04/30/21 2347   acetaminophen (TYLENOL) tablet 650 mg  650 mg Oral Q6H PRN Dwyane Dee, MD   650 mg at 04/30/21 1831   Or   acetaminophen (TYLENOL) suppository 650 mg  650 mg Rectal Q6H PRN Dwyane Dee, MD       cefTRIAXone (ROCEPHIN) 2 g in sodium chloride 0.9 % 100 mL IVPB  2 g Intravenous Daily Dwyane Dee, MD       metroNIDAZOLE (FLAGYL) IVPB 500 mg  500 mg Intravenous BID Dwyane Dee, MD 100 mL/hr at 05/01/21 0825 500 mg at 05/01/21 0825   morphine 2 MG/ML injection 2 mg  2 mg Intravenous Q3H PRN Dwyane Dee, MD       ondansetron [facility]) tablet 4 mg  4 mg Oral Q6H PRN Dwyane Dee, MD       Or   ondansetron [facility]) injection 4 mg  4 mg Intravenous Q6H PRN Dwyane Dee, MD  4 mg at 04/30/21 2006   potassium chloride SA (KLOR-CON M) CR tablet 40 mEq  40 mEq Oral Once Dwyane Dee, MD       prochlorperazine (COMPAZINE) injection 10 mg  10 mg Intravenous Q6H PRN Dwyane Dee, MD       sodium chloride flush (NS) 0.9 % injection 3 mL  3 mL Intravenous Q12H Dwyane Dee, MD   3 mL at 04/30/21 2122    Allergies as of 04/30/2021 - Review Complete 04/30/2021  Allergen Reaction Noted   Pineapple Anaphylaxis 01/09/2011   Penicillins Hives 07/19/2010   Penicillins Hives 08/12/2020   Pineapple Hives 08/12/2020    Family History  Problem Relation Age of Onset   Migraines Mother    Crohn's disease Mother     Migraines Maternal Grandmother    Diverticulosis Maternal Grandmother    Migraines Maternal Aunt     Social History   Socioeconomic History   Marital status: Single    Spouse name: Not on file   Number of children: Not on file   Years of education: Not on file   Highest education level: Not on file  Occupational History   Not on file  Tobacco Use   Smoking status: Every Day    Packs/day: 0.50    Types: Cigarettes, Cigars   Smokeless tobacco: Never   Tobacco comments:    Mom smokes outside only, pt lives alone currently (09/12/19)  Vaping Use   Vaping Use: Never used  Substance and Sexual Activity   Alcohol use: Yes    Comment: occ   Drug use: Yes    Types: Marijuana   Sexual activity: Yes    Birth control/protection: None  Other Topics Concern   Not on file  Social History Narrative   ** Merged History Encounter **       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 She attends [employer]  She lives with her mother and her sister.  She enjoys going out to the movies and being on her phone.    Social Determinants of Health   Financial Resource Strain: Not on file  Food Insecurity: Not on file  Transportation Needs: Not on file  Physical Activity: Not on file  Stress: Not on file  Social Connections: Not on file  Intimate Partner Violence: Not on file    Review of Systems: Positive for: GI: Described in detail in HPI.    Gen: fever, chills, rigors, denies any  night sweats, anorexia, fatigue, weakness, malaise, involuntary weight loss, and sleep disorder CV: Denies chest pain, angina, palpitations, syncope, orthopnea, PND, peripheral edema, and claudication. Resp: Denies dyspnea, cough, sputum, wheezing, coughing up blood. GU : Denies urinary burning, blood in urine, urinary frequency, urinary hesitancy, nocturnal urination, and urinary incontinence. MS: Denies joint pain or swelling.  Denies muscle weakness, cramps, atrophy.  Derm: Denies rash, itching, oral  ulcerations, hives, unhealing ulcers.  Psych: Denies depression, anxiety, memory loss, suicidal ideation, hallucinations,  and confusion. Heme: Denies bruising, bleeding, and enlarged lymph nodes. Neuro:  Denies any headaches, dizziness, paresthesias. Endo:  Denies any problems with DM, thyroid, adrenal function.  Physical Exam: Vital signs in last 24 hours: Temp:  [98.2 F (36.8 C)-100.3 F (37.9 C)] 98.4 F (36.9 C) (12/27 0535) Pulse Rate:  [73-102] 73 (12/27 0535) Resp:  [16-25] 18 (12/27 0535) BP: (95-111)/(59-67) 97/61 (12/27 0535) SpO2:  [98 %-100 %] 100 % (12/27 0535) Last BM Date: 05/01/21  General:   Alert,  Well-developed, well-nourished, pleasant  and cooperative in NAD Head:  Normocephalic and atraumatic. Eyes:  Sclera clear, no icterus.   Conjunctiva pink. Ears:  Normal auditory acuity. Nose:  No deformity, discharge,  or lesions. Mouth:  No deformity or lesions.  Oropharynx pink & moist. Neck:  Supple; no masses or thyromegaly. Lungs:  Clear throughout to auscultation.   No wheezes, crackles, or rhonchi. No acute distress. Heart:  Regular rate and rhythm; no murmurs, clicks, rubs,  or gallops. Extremities:  Without clubbing or edema. Neurologic:  Alert and  oriented x4;  grossly normal neurologically. Skin:  Intact without significant lesions or rashes. Psych:  Alert and cooperative. Normal mood and affect. Abdomen:  Soft, nontender and nondistended. No masses, hepatosplenomegaly or hernias noted. Normal bowel sounds, without guarding, and without rebound.         Lab Results: Recent Labs    04/30/21 1040 04/30/21 1330 05/01/21 0505  WBC 27.5* 29.1* 20.3*  HGB 13.0 12.0 12.7  HCT 37.5 34.8* 37.4  PLT 203 195 195   BMET Recent Labs    04/30/21 1040 05/01/21 0505  NA 134* 137  K 2.8* 3.5  CL 101 106  CO2 23 22  GLUCOSE 103* 79  BUN 10 10  CREATININE 0.66 0.82  CALCIUM 9.0 9.0   LFT Recent Labs    04/30/21 1040  PROT 6.9  ALBUMIN 4.0  AST  13*  ALT 11  ALKPHOS 51  BILITOT 1.9*   PT/INR Recent Labs    05/01/21 0505  LABPROT 18.3*  INR 1.5*    Studies/Results: CT Abdomen Pelvis W Contrast  Result Date: 04/30/2021 CLINICAL DATA:  Acute lower abdominal pain, nausea, vomiting, and diarrhea since yesterday, seen for same thing 6 days ago EXAM: CT ABDOMEN AND PELVIS WITH CONTRAST TECHNIQUE: Multidetector CT imaging of the abdomen and pelvis was performed using the standard protocol following bolus administration of intravenous contrast. CONTRAST:  168m OMNIPAQUE IOHEXOL 300 MG/ML SOLN IV. Dilute oral contrast. COMPARISON:  08/12/2020 FINDINGS: Lower chest: Lung bases clear Hepatobiliary: Focal fatty infiltration of liver adjacent to falciform fissure. Gallbladder and liver otherwise normal appearance. Pancreas: Normal appearance Spleen: Normal appearance Adrenals/Urinary Tract: Adrenal glands, kidneys, ureters, and bladder normal appearance Stomach/Bowel: Normal appendix. Stomach incompletely distended, unable to exclude distal wall thickening in this setting. Small bowel loops grossly unremarkable. Colon is unopacified in under distended, no gross abnormality seen. Vascular/Lymphatic: Vascular structures patent.  No adenopathy. Reproductive: Unremarkable uterus and ovaries for age Other: No free air or free fluid. No hernia or definite inflammatory process. Musculoskeletal: Unremarkable IMPRESSION: No definite acute intra-abdominal or intrapelvic abnormalities. Electronically Signed   By: MLavonia DanaM.D.   On: 04/30/2021 12:52   DG Chest Port 1 View  Result Date: 04/30/2021 CLINICAL DATA:  Chest pain, vomiting EXAM: PORTABLE CHEST 1 VIEW COMPARISON:  08/12/2020 FINDINGS: The heart size and mediastinal contours are within normal limits. Both lungs are clear. The visualized skeletal structures are unremarkable. IMPRESSION: No active disease. Electronically Signed   By: PElmer PickerM.D.   On: 04/30/2021 13:26     Impression: Nausea and vomiting-likely related to marijuana use Abdominal pain with diarrhea, and fever as per patient?  Infectious enterocolitis WBC count 20.3  Significant alcohol use, PT 18.3, INR 1.5, normal LFTs, T bili 1.9/AST 13/ALT 11/ALP 51 Normal lipase  CAT scan negative for acute intra-abdominal or intrapelvic pathology Hemoglobin normal 12.7, platelet normal 195 C. difficile negative FOBT negative GI pathogen panel pending  Plan: Although patient has family history of  Crohn's disease in her mom, her CT abdomen and pelvis is unremarkable for stomach, small bowel or colonic inflammation/thickening, which makes IBD less likely. With elevated leukocytosis, ESR and CRP will be nonspecific markers for further investigation. Will await results of GI pathogen panel. Meanwhile we will send fecal calprotectin Continue clear liquid diet Has been empirically started on IV Rocephin and IV Flagyl   LOS: 1 day   Ronnette Juniper, MD  05/01/2021, 9:39 AM

## 2021-05-01 NOTE — Progress Notes (Signed)
Progress Note    Patricia [name]   UJW:[id number]  DOB: February 02, 2001  DOA: 04/30/2021     1 PCP: [facility]  Initial CC: abdominal pain, N/V, bloody stools  Hospital Course: [name] is a 20 yo female with PMH allergic rhinitis who presented to med [facility] with ongoing abdominal pain, vomiting, and diarrhea. She was also evaluated for similar symptoms on 04/24/2021 and considered to have a possible UTI and was prescribed Keflex although she has not picked it up since evaluated.  She was also considered to have possible alcohol excessive consumption or a viral syndrome.  Due to not feeling well, she presented back for further evaluation.  She has not been able to tolerate a diet due to her ongoing abdominal pain and nausea/vomiting.  She also endorses seeing blood mixed in her stools.  She says her menstrual cycle ended approximately 1 week ago but she feels like she has been noticing blood coming from both her vagina and rectum. She underwent CT abdomen/pelvis on work-up and had no acute abnormalities noted.  The CT did mention that the stomach was incompletely distended and unable to exclude distal wall thickening. Urinalysis was positive for small LE, negative nitrite, greater than 50 WBC, few bacteria.  She was given a dose of Rocephin. Notable labs include: Lipase 23 Na 134, K 2.8, TB 1.9 WBC 29.1, Hgb 12, Hct 34.8, PLTC 195  She endorsed a history of Crohn's disease in her mother who has required surgery.  Her maternal grandmother also has diverticulosis.  She endorses smoking Black and milds twice a day as well as marijuana twice a day, last used marijuana 04/29/21. She endorses social alcohol use drinking tequila approximately twice a week.  She denied any other illicit drug use.  She is transferred from [facility] for further work-up of her subjective abdominal pain, bloody stools, and concern for UTI as well.  Interval History:  Still having bloody  bowel movements this morning.  Bedside commode noted with some bloody stools during my rounds.  Her abdominal pain is a little better but still present this morning.  Assessment & Plan: * Abdominal pain- (present on admission) - Given family history and reports of abdominal pain with bloody stools, differential at this time includes undiagnosed IBD versus possible referred pain from UTI although does not explain bloody stools (given that she cannot delineate if coming from vagina versus rectum, could also be lingering menstrual cycle clearing) -Check FOBT: negative -Continue Rocephin; add flagyl - check cdiff and GI pathogen panel: both negative - starting imodium and simethicone for her diarrhea and gas/bloating respectively  - follow up fecal calprotectin  - continue CLD for now  - GI now following; appreciate assistance.  - Given negative infectious workup so far, with bloody BM and family history of IBD, still concerned for her risk  - continue IVF - continue pain and nausea control   Nausea & vomiting - at risk for hyperemesis due to her underlying THC use (BID per patient) - continue nausea control  - see abd pain workup as well   Acute lower UTI- (present on admission) - Patient not really describing any significant urinary symptoms however will continue treatment at this time with Rocephin, since will also treat abdominal source -Follow-up urine culture: Group B strep isolated - continue rocephin and will complete 3-5 day course (treating abdominal coverage as well, see above)  Hypokalemia - replete as needed    Old records reviewed in  assessment of this patient  Antimicrobials: Rocephin 04/30/2021 >> current Flagyl 04/30/2021 >> current  DVT prophylaxis: SCD  Code Status:   Code Status: Full Code  Disposition Plan:   Status is: Inpt  Objective: Blood pressure 97/61, pulse 73, temperature 98.4 F (36.9 C), temperature source Oral, resp. rate 18, height 5\' 4"   (1.626 m), weight 54.4 kg, last menstrual period 04/19/2021, SpO2 100 %.  Examination:  Physical Exam Exam conducted with a chaperone present.  Constitutional:      Appearance: Normal appearance.     Comments: Thin, fatigued appearing and uncomfortable  HENT:     Head: Normocephalic and atraumatic.     Mouth/Throat:     Mouth: Mucous membranes are moist.  Eyes:     Extraocular Movements: Extraocular movements intact.  Cardiovascular:     Rate and Rhythm: Normal rate and regular rhythm.  Pulmonary:     Effort: Pulmonary effort is normal.     Breath sounds: Normal breath sounds.  Abdominal:     Comments: Thin, soft. TTP throughout (but better some this morning), no R/G. BS present  Genitourinary:    Comments: No gross blood appreciated from rectal vault of vaginal orifice Musculoskeletal:        General: Normal range of motion.     Cervical back: Normal range of motion and neck supple.  Skin:    General: Skin is warm and dry.  Neurological:     General: No focal deficit present.     Mental Status: She is alert.  Psychiatric:        Mood and Affect: Mood normal.        Behavior: Behavior normal.     Consultants:  GI  Procedures:    Data Reviewed: I have personally reviewed labs and imaging studies    LOS: 1 day  Time spent: Greater than 50% of the 35 minute visit was spent in counseling/coordination of care for the patient as laid out in the A&P.   Dwyane Dee, MD [facility] 05/01/2021, 2:16 PM

## 2021-05-01 NOTE — TOC Initial Note (Signed)
Transition of Care [facility]) - Initial/Assessment Note    Patient Details  Name: Patricia [name] MRN: [id number] Date of Birth: 09-Jun-2000  Transition of Care [facility]) CM/SW Contact:    Golda Acre, RN Phone Number: 05/01/2021, 9:24 AM  Clinical Narrative:                  Transition of Care [facility]) Screening Note   Patient Details  Name: Patricia [name] Date of Birth: [birth year]   Transition of Care [facility]) CM/SW Contact:    Golda Acre, RN Phone Number: 05/01/2021, 9:24 AM    Transition of Care Department (TOC) has reviewed patient and no TOC needs have been identified at this time. We will continue to monitor patient advancement through interdisciplinary progression rounds. If new patient transition needs arise, please place a TOC consult.    Expected Discharge Plan: Home/Self Care Barriers to Discharge: Continued Medical Work up   Patient Goals and CMS Choice Patient states their goals for this hospitalization and ongoing recovery are:: to go home CMS Medicare.gov Compare Post Acute Care list provided to:: Patient    Expected Discharge Plan and Services Expected Discharge Plan: Home/Self Care   Discharge Planning Services: CM Consult   Living arrangements for the past 2 months: Single Family Home                                      Prior Living Arrangements/Services Living arrangements for the past 2 months: Single Family Home Lives with:: Self Patient language and need for interpreter reviewed:: Yes Do you feel safe going back to the place where you live?: Yes      Need for Family Participation in Patient Care: No (Comment) Care giver support system in place?: No (comment)   Criminal Activity/Legal Involvement Pertinent to Current Situation/Hospitalization: No - Comment as needed  Activities of Daily Living Home Assistive Devices/Equipment: None ADL Screening (condition at time of admission) Is the patient deaf or have difficulty hearing?:  No Does the patient have difficulty seeing, even when wearing glasses/contacts?: No Does the patient have difficulty concentrating, remembering, or making decisions?: No Does the patient have difficulty dressing or bathing?: No Does the patient have difficulty walking or climbing stairs?: No  Permission Sought/Granted                  Emotional Assessment Appearance:: Appears stated age Attitude/Demeanor/Rapport: Engaged Affect (typically observed): Calm Orientation: : Oriented to Self, Oriented to Place, Oriented to  Time, Oriented to Situation Alcohol / Substance Use: Alcohol Use Psych Involvement: No (comment)  Admission diagnosis:  Leukocytosis [D72.829] Pyelonephritis [N12] Vomiting and diarrhea [R11.10, R19.7] Leukocytosis, unspecified type [D72.829] Abdominal pain [R10.9] Patient Active Problem List   Diagnosis Date Noted   Acute lower UTI 04/30/2021   Abdominal pain 04/30/2021   Hypokalemia 04/30/2021   Closed displaced fracture of neck of right femur (HCC) 08/12/2020   Closed right hip fracture (HCC) 08/12/2020   Chlamydia infection 12/17/2018   Bacterial vaginosis 12/17/2018   Possible exposure to STD 06/03/2017   Contraception management 04/07/2016   Episodic tension-type headache, not intractable 02/24/2015   Migraine without aura and without status migrainosus, not intractable 09/19/2014   Migraine 09/05/2014   Depression 10/15/2012   ACNE VULGARIS 12/07/2009   Seasonal allergies 11/28/2006   PCP:  [facility] Pharmacy:   [id number] - [place],  - [address]  [address] [place] [address] Phone: [phone] Fax: [phone]     Social Determinants of Health (SDOH) Interventions    Readmission Risk Interventions No flowsheet data found.

## 2021-05-02 DIAGNOSIS — E876 Hypokalemia: Secondary | ICD-10-CM

## 2021-05-02 DIAGNOSIS — R109 Unspecified abdominal pain: Secondary | ICD-10-CM | POA: Diagnosis not present

## 2021-05-02 DIAGNOSIS — N390 Urinary tract infection, site not specified: Secondary | ICD-10-CM | POA: Diagnosis not present

## 2021-05-02 LAB — BASIC METABOLIC PANEL
Anion gap: 9 (ref 5–15)
BUN: 9 mg/dL (ref 6–20)
CO2: 19 mmol/L — ABNORMAL LOW (ref 22–32)
Calcium: 8.6 mg/dL — ABNORMAL LOW (ref 8.9–10.3)
Chloride: 108 mmol/L (ref 98–111)
Creatinine, Ser: 0.63 mg/dL (ref 0.44–1.00)
GFR, Estimated: >60 mL/min (ref >60)
Glucose, Bld: 81 mg/dL (ref 70–99)
Potassium: 3.3 mmol/L — ABNORMAL LOW (ref 3.5–5.1)
Sodium: 136 mmol/L (ref 135–145)

## 2021-05-02 LAB — CBC WITH DIFFERENTIAL/PLATELET
Abs Immature Granulocytes: 0.12 10*3/uL — ABNORMAL HIGH (ref 0.00–0.07)
Basophils Absolute: 0 10*3/uL (ref 0.0–0.1)
Basophils Relative: 0 %
Eosinophils Absolute: 0.1 10*3/uL (ref 0.0–0.5)
Eosinophils Relative: 1 %
HCT: 33.4 % — ABNORMAL LOW (ref 36.0–46.0)
Hemoglobin: 11.6 g/dL — ABNORMAL LOW (ref 12.0–15.0)
Immature Granulocytes: 1 %
Lymphocytes Relative: 10 %
Lymphs Abs: 1.5 10*3/uL (ref 0.7–4.0)
MCH: 32 pg (ref 26.0–34.0)
MCHC: 34.7 g/dL (ref 30.0–36.0)
MCV: 92.3 fL (ref 80.0–100.0)
Monocytes Absolute: 1.1 10*3/uL — ABNORMAL HIGH (ref 0.1–1.0)
Monocytes Relative: 7 %
Neutro Abs: 12.9 10*3/uL — ABNORMAL HIGH (ref 1.7–7.7)
Neutrophils Relative %: 81 %
Platelets: 205 10*3/uL (ref 150–400)
RBC: 3.62 MIL/uL — ABNORMAL LOW (ref 3.87–5.11)
RDW: 12.7 % (ref 11.5–15.5)
WBC: 15.7 10*3/uL — ABNORMAL HIGH (ref 4.0–10.5)
nRBC: 0 % (ref 0.0–0.2)

## 2021-05-02 LAB — MAGNESIUM: Magnesium: 2.1 mg/dL (ref 1.7–2.4)

## 2021-05-02 MED ORDER — POTASSIUM CHLORIDE CRYS ER 20 MEQ PO TBCR
40.0000 meq | EXTENDED_RELEASE_TABLET | Freq: Once | ORAL | Status: AC
  Administered 2021-05-02: 09:00:00 40 meq via ORAL
  Filled 2021-05-02: qty 2

## 2021-05-02 MED ORDER — HEPARIN SODIUM (PORCINE) 5000 UNIT/ML IJ SOLN
5000.0000 [IU] | Freq: Three times a day (TID) | INTRAMUSCULAR | Status: DC
  Administered 2021-05-02 – 2021-05-03 (×2): 5000 [IU] via SUBCUTANEOUS
  Filled 2021-05-02 (×2): qty 1

## 2021-05-02 MED ORDER — PEG 3350-KCL-NA BICARB-NACL 420 G PO SOLR
4000.0000 mL | Freq: Once | ORAL | Status: AC
  Administered 2021-05-02: 15:00:00 4000 mL via ORAL

## 2021-05-02 NOTE — Progress Notes (Signed)
PROGRESS NOTE    Patricia [name]  HQI:[id number] DOB: 06/02/2000 DOA: 04/30/2021 PCP: [facility]   Chief Complaint  Patient presents with   Abdominal Pain    Brief Narrative:  Ms. Luhmann is a 20 yo female with PMH allergic rhinitis who presented to med [facility] with ongoing abdominal pain, vomiting, and diarrhea. She was also evaluated for similar symptoms on 04/24/2021 and considered to have a possible UTI and was prescribed Keflex although she has not picked it up since evaluated. Assessment & Plan:   Principal Problem:   Abdominal pain Active Problems:   Acute lower UTI   Hypokalemia   Nausea & vomiting  Abdominal pain In the setting of bloody stools, diarrhea FOBT is negative C. difficile and GI pathogen are both negative GI consulted appreciate recommendations, diagnostic colonoscopy in the morning. Slowly advance diet as tolerated Symptomatic management with IV fluids and antiemetics.   Hypokalemia Replaced  Acute lower urinary tract infection Continue with Rocephin to complete the course.  DVT prophylaxis: (heparin) Code Status: (full code.) Family Communication: none at bedside.  Disposition:   Status is: Inpatient  Remains inpatient appropriate because: IV antibiotics.        Consultants:  Gastroenterology  Procedures: none.   Antimicrobials: Antibiotics Given (last 72 hours)     Date/Time Action Medication Dose Rate   04/30/21 1524 New Bag/Given   cefTRIAXone (ROCEPHIN) 1 g in sodium chloride 0.9 % 100 mL IVPB 1 g 200 mL/hr   04/30/21 1836 New Bag/Given   metroNIDAZOLE (FLAGYL) IVPB 500 mg 500 mg 100 mL/hr   05/01/21 0825 New Bag/Given   metroNIDAZOLE (FLAGYL) IVPB 500 mg 500 mg 100 mL/hr   05/01/21 1057 New Bag/Given   cefTRIAXone (ROCEPHIN) 2 g in sodium chloride 0.9 % 100 mL IVPB 2 g 200 mL/hr   05/01/21 2042 New Bag/Given   metroNIDAZOLE (FLAGYL) IVPB 500 mg 500 mg 100 mL/hr   05/02/21 0816 New  Bag/Given   metroNIDAZOLE (FLAGYL) IVPB 500 mg 500 mg 100 mL/hr   05/02/21 0854 New Bag/Given   cefTRIAXone (ROCEPHIN) 2 g in sodium chloride 0.9 % 100 mL IVPB 2 g 200 mL/hr         Subjective: Pain resolved. No nausea this am. Will advance diet.   Objective: Vitals:   05/01/21 1400 05/01/21 1948 05/02/21 0516 05/02/21 1300  BP: 102/68 102/61 99/65 112/72  Pulse: 70 83 80 82  Resp: 18 18 18 18   Temp: 98.3 F (36.8 C) (!) 100.5 F (38.1 C) 98.3 F (36.8 C) 98.7 F (37.1 C)  TempSrc: Oral Oral Oral Oral  SpO2: 100% 100% 100% 100%  Weight:      Height:        Intake/Output Summary (Last 24 hours) at 05/02/2021 1955 Last data filed at 05/02/2021 0400 Gross per 24 hour  Intake 1325.97 ml  Output --  Net 1325.97 ml   Filed Weights   04/30/21 0936  Weight: 54.4 kg    Examination:  General exam: Appears calm and comfortable  Respiratory system: Clear to auscultation. Respiratory effort normal. Cardiovascular system: S1 & S2 heard, RRR. No JVD,  No pedal edema. Gastrointestinal system: Abdomen is soft, non tender bowel sounds wnl.  Central nervous system: Alert and oriented. No focal neurological deficits. Extremities: Symmetric 5 x 5 power. Skin: No rashes, lesions or ulcers Psychiatry: Mood & affect appropriate.     Data Reviewed: I have personally reviewed following labs and imaging studies  CBC: Recent  Labs  Lab 04/30/21 1040 04/30/21 1330 05/01/21 0505 05/02/21 0506  WBC 27.5* 29.1* 20.3* 15.7*  NEUTROABS 25.0* 25.5* 17.5* 12.9*  HGB 13.0 12.0 12.7 11.6*  HCT 37.5 34.8* 37.4 33.4*  MCV 91.7 92.8 94.7 92.3  PLT 203 195 195 205    Basic Metabolic Panel: Recent Labs  Lab 04/30/21 1040 05/01/21 0505 05/02/21 0506  NA 134* 137 136  K 2.8* 3.5 3.3*  CL 101 106 108  CO2 23 22 19*  GLUCOSE 103* 79 81  BUN 10 10 9   CREATININE 0.66 0.82 0.63  CALCIUM 9.0 9.0 8.6*  MG  --  2.3 2.1    GFR: Estimated Creatinine Clearance: 95.5 mL/min (by C-G  formula based on SCr of 0.63 mg/dL).  Liver Function Tests: Recent Labs  Lab 04/30/21 1040  AST 13*  ALT 11  ALKPHOS 51  BILITOT 1.9*  PROT 6.9  ALBUMIN 4.0    CBG: No results for input(s): GLUCAP in the last 168 hours.   Recent Results (from the past 240 hour(s))  Urine Culture     Status: Abnormal   Collection Time: 04/24/21  7:21 PM   Specimen: Urine, Clean Catch  Result Value Ref Range Status   Specimen Description   Final    URINE, CLEAN CATCH Performed at [facility], [address] [place], [place]    Special Requests   Final    NONE Performed at [facility], [address] [place], [place]    Culture (A)  Final    >=100,000 COLONIES/mL GROUP B STREP(S.AGALACTIAE)ISOLATED TESTING AGAINST S. AGALACTIAE NOT ROUTINELY PERFORMED DUE TO PREDICTABILITY OF AMP/PEN/VAN SUSCEPTIBILITY. Performed at [facility], [address], [place], [place]    Report Status 04/26/2021 FINAL  Final  Resp Panel by RT-PCR (Flu A&B, Covid) Nasopharyngeal Swab     Status: None   Collection Time: 04/24/21 10:29 PM   Specimen: Nasopharyngeal Swab; Nasopharyngeal(NP) swabs in vial transport medium  Result Value Ref Range Status   SARS Coronavirus 2 by RT PCR NEGATIVE NEGATIVE Final    Comment: (NOTE) SARS-CoV-2 target nucleic acids are NOT DETECTED.  The SARS-CoV-2 RNA is generally detectable in upper respiratory specimens during the acute phase of infection. The lowest concentration of SARS-CoV-2 viral copies this assay can detect is 138 copies/mL. A negative result does not preclude SARS-Cov-2 infection and should not be used as the sole basis for treatment or other patient management decisions. A negative result may occur with  improper specimen collection/handling, submission of specimen other than nasopharyngeal swab, presence of viral mutation(s) within the areas targeted by this assay, and inadequate number of  viral copies(<138 copies/mL). A negative result must be combined with clinical observations, patient history, and epidemiological information. The expected result is Negative.  Fact Sheet for Patients:  04/26/21  Fact Sheet for Healthcare Providers:  BloggerCourse.com  This test is no t yet approved or cleared by the [place] FDA and  has been authorized for detection and/or diagnosis of SARS-CoV-2 by FDA under an Emergency Use Authorization (EUA). This EUA will remain  in effect (meaning this test can be used) for the duration of the COVID-19 declaration under Section 564(b)(1) of the Act, 21 U.S.C.section 360bbb-3(b)(1), unless the authorization is terminated  or revoked sooner.       Influenza A by PCR NEGATIVE NEGATIVE Final   Influenza B by PCR NEGATIVE NEGATIVE Final    Comment: (NOTE) The  Xpert Xpress SARS-CoV-2/FLU/RSV plus assay is intended as an aid in the diagnosis of influenza from Nasopharyngeal swab specimens and should not be used as a sole basis for treatment. Nasal washings and aspirates are unacceptable for Xpert Xpress SARS-CoV-2/FLU/RSV testing.  Fact Sheet for Patients: BloggerCourse.com  Fact Sheet for Healthcare Providers: SeriousBroker.it  This test is not yet approved or cleared by the [place] FDA and has been authorized for detection and/or diagnosis of SARS-CoV-2 by FDA under an Emergency Use Authorization (EUA). This EUA will remain in effect (meaning this test can be used) for the duration of the COVID-19 declaration under Section 564(b)(1) of the Act, 21 U.S.C. section 360bbb-3(b)(1), unless the authorization is terminated or revoked.  Performed at [facility], [address] [place], [place] [address]   Culture, blood (routine x 2)     Status: None (Preliminary result)   Collection Time: 04/30/21  2:29 PM    Specimen: BLOOD  Result Value Ref Range Status   Specimen Description   Final    BLOOD Blood Culture adequate volume Performed at [facility], [address] [place], [place] [address]    Special Requests   Final    BOTTLES DRAWN AEROBIC AND ANAEROBIC LEFT ANTECUBITAL Performed at [facility], [address] [place], [place] [address]    Culture   Final    NO GROWTH 2 DAYS Performed at [facility], [address] [place], [place] [address]    Report Status PENDING  Incomplete  Culture, blood (routine x 2)     Status: None (Preliminary result)   Collection Time: 04/30/21  2:47 PM   Specimen: BLOOD  Result Value Ref Range Status   Specimen Description   Final    BLOOD Blood Culture adequate volume Performed at [facility], [address] [place], [place] [address]    Special Requests   Final    BOTTLES DRAWN AEROBIC AND ANAEROBIC RIGHT ANTECUBITAL Performed at [facility], [address] [place], [place] [address]    Culture   Final    NO GROWTH 2 DAYS Performed at [facility], [address], [place], [place] [address]    Report Status PENDING  Incomplete  Resp Panel by RT-PCR (Flu A&B, Covid) Peripheral     Status: None   Collection Time: 04/30/21  2:49 PM   Specimen: Peripheral; Nasopharyngeal(NP) swabs in vial transport medium  Result Value Ref Range Status   SARS Coronavirus 2 by RT PCR NEGATIVE NEGATIVE Final    Comment: (NOTE) SARS-CoV-2 target nucleic acids are NOT DETECTED.  The SARS-CoV-2 RNA is generally detectable in upper respiratory specimens during the acute phase of infection. The lowest concentration of SARS-CoV-2 viral copies this assay can detect is 138 copies/mL. A negative result does not preclude SARS-Cov-2 infection and should not be used as the sole basis for treatment or other patient management decisions. A negative result may occur with  improper specimen collection/handling,  submission of specimen other than nasopharyngeal swab, presence of viral mutation(s) within the areas targeted by this assay, and inadequate number of viral copies(<138 copies/mL). A negative result must be combined with clinical observations, patient history, and epidemiological information. The expected result is Negative.  Fact Sheet for Patients:  BloggerCourse.com  Fact Sheet for Healthcare Providers:  SeriousBroker.it  This test is no t yet approved or cleared by the [place] FDA and  has  been authorized for detection and/or diagnosis of SARS-CoV-2 by FDA under an Emergency Use Authorization (EUA). This EUA will remain  in effect (meaning this test can be used) for the duration of the COVID-19 declaration under Section 564(b)(1) of the Act, 21 U.S.C.section 360bbb-3(b)(1), unless the authorization is terminated  or revoked sooner.       Influenza A by PCR NEGATIVE NEGATIVE Final   Influenza B by PCR NEGATIVE NEGATIVE Final    Comment: (NOTE) The Xpert Xpress SARS-CoV-2/FLU/RSV plus assay is intended as an aid in the diagnosis of influenza from Nasopharyngeal swab specimens and should not be used as a sole basis for treatment. Nasal washings and aspirates are unacceptable for Xpert Xpress SARS-CoV-2/FLU/RSV testing.  Fact Sheet for Patients: BloggerCourse.com  Fact Sheet for Healthcare Providers: SeriousBroker.it  This test is not yet approved or cleared by the [place] FDA and has been authorized for detection and/or diagnosis of SARS-CoV-2 by FDA under an Emergency Use Authorization (EUA). This EUA will remain in effect (meaning this test can be used) for the duration of the COVID-19 declaration under Section 564(b)(1) of the Act, 21 U.S.C. section 360bbb-3(b)(1), unless the authorization is terminated or revoked.  Performed at [facility], [address] [place], [place] [address]   Gastrointestinal Panel by PCR , Stool     Status: None   Collection Time: 04/30/21 11:13 PM   Specimen: STOOL  Result Value Ref Range Status   Campylobacter species NOT DETECTED NOT DETECTED Final   Plesimonas shigelloides NOT DETECTED NOT DETECTED Final   Salmonella species NOT DETECTED NOT DETECTED Final   Yersinia enterocolitica NOT DETECTED NOT DETECTED Final   Vibrio species NOT DETECTED NOT DETECTED Final   Vibrio cholerae NOT DETECTED NOT DETECTED Final   Enteroaggregative E coli (EAEC) NOT DETECTED NOT DETECTED Final   Enteropathogenic E coli (EPEC) NOT DETECTED NOT DETECTED Final   Enterotoxigenic E coli (ETEC) NOT DETECTED NOT DETECTED Final   Shiga like toxin producing E coli (STEC) NOT DETECTED NOT DETECTED Final   Shigella/Enteroinvasive E coli (EIEC) NOT DETECTED NOT DETECTED Final   Cryptosporidium NOT DETECTED NOT DETECTED Final   Cyclospora cayetanensis NOT DETECTED NOT DETECTED Final   Entamoeba histolytica NOT DETECTED NOT DETECTED Final   Giardia lamblia NOT DETECTED NOT DETECTED Final   Adenovirus F40/41 NOT DETECTED NOT DETECTED Final   Astrovirus NOT DETECTED NOT DETECTED Final   Norovirus GI/GII NOT DETECTED NOT DETECTED Final   Rotavirus A NOT DETECTED NOT DETECTED Final   Sapovirus (I, II, IV, and V) NOT DETECTED NOT DETECTED Final    Comment: Performed at [facility], [address] [place], [place] [address]  C Difficile Quick Screen w PCR reflex     Status: None   Collection Time: 04/30/21 11:13 PM  Result Value Ref Range Status   C Diff antigen NEGATIVE NEGATIVE Final   C Diff toxin NEGATIVE NEGATIVE Final   C Diff interpretation NEGATIVE  Final    Comment: Performed at [facility], [address] [place], [place] [address]         Radiology Studies: No results found.      Scheduled Meds:  sodium chloride flush  3 mL Intravenous Q12H   Continuous Infusions:   sodium chloride 75 mL/hr at 05/02/21 0508   cefTRIAXone (ROCEPHIN)  IV 2 g (05/02/21 0854)   metronidazole 500 mg (05/02/21 0816)     LOS: 2 days  Kathlen Mody, MD [facility]   To contact the attending provider between 7A-7P or the covering provider during after hours 7P-7A, please log into the web site [email or web address] and access using universal  password for that web site. If you do not have the password, please call the hospital operator.  05/02/2021, 7:55 PM

## 2021-05-02 NOTE — Progress Notes (Signed)
Patient voiced that she completed her prep. Stool appears light green watery without fecal material.  Patient aware she is NPO past MN for procedure in AM.

## 2021-05-02 NOTE — Progress Notes (Signed)
Subjective: Patient had to take Imodium for diarrhea yesterday and had an episode of fecal incontinence today. She continues to have lower abdominal discomfort. She reports improvement in nausea and vomiting.  Objective: Vital signs in last 24 hours: Temp:  [98.3 F (36.8 C)-100.5 F (38.1 C)] 98.3 F (36.8 C) (12/28 0516) Pulse Rate:  [70-83] 80 (12/28 0516) Resp:  [18] 18 (12/28 0516) BP: (99-102)/(61-68) 99/65 (12/28 0516) SpO2:  [100 %] 100 % (12/28 0516) Weight change:  Last BM Date: 05/01/21  PE: Thin, not in distress, moist mucous membranes GENERAL: Mild pallor, able to speak in full sentences and not in respiratory distress ABDOMEN: Nondistended EXTREMITIES: No edema  Lab Results: Results for orders placed or performed during the hospital encounter of 04/30/21 (from the past 48 hour(s))  CBC with Differential     Status: Abnormal   Collection Time: 04/30/21  1:30 PM  Result Value Ref Range   WBC 29.1 (H) 4.0 - 10.5 K/uL   RBC 3.75 (L) 3.87 - 5.11 MIL/uL   Hemoglobin 12.0 12.0 - 15.0 g/dL   HCT 34.8 (L) 36.0 - 46.0 %   MCV 92.8 80.0 - 100.0 fL   MCH 32.0 26.0 - 34.0 pg   MCHC 34.5 30.0 - 36.0 g/dL   RDW 12.8 11.5 - 15.5 %   Platelets 195 150 - 400 K/uL   nRBC 0.0 0.0 - 0.2 %   Neutrophils Relative % 90 %   Neutro Abs 25.5 (H) 1.7 - 7.7 K/uL   Lymphocytes Relative 3 %   Lymphs Abs 1.0 0.7 - 4.0 K/uL   Monocytes Relative 4 %   Monocytes Absolute 1.0 0.1 - 1.0 K/uL   Eosinophils Relative 0 %   Eosinophils Absolute 0.0 0.0 - 0.5 K/uL   Basophils Relative 0 %   Basophils Absolute 0.1 0.0 - 0.1 K/uL   Immature Granulocytes 3 %   Abs Immature Granulocytes 0.88 (H) 0.00 - 0.07 K/uL    Comment: Performed at [facility], [address] [place], [place] [address]  Culture, blood (routine x 2)     Status: None (Preliminary result)   Collection Time: 04/30/21  2:29 PM   Specimen: BLOOD  Result Value Ref Range   Specimen Description      BLOOD Blood  Culture adequate volume Performed at [facility], [address] [place], [place] [address]    Special Requests      BOTTLES DRAWN AEROBIC AND ANAEROBIC LEFT ANTECUBITAL Performed at [facility], [address] [place], [place] [address]    Culture      NO GROWTH 2 DAYS Performed at [facility], [address] [place], [place] [address]    Report Status PENDING   Lactic acid, plasma     Status: None   Collection Time: 04/30/21  2:29 PM  Result Value Ref Range   Lactic Acid, Venous 0.9 0.5 - 1.9 mmol/L    Comment: Performed at [facility], [address] [place], [place] [address]  Culture, blood (routine x 2)     Status: None (Preliminary result)   Collection Time: 04/30/21  2:47 PM   Specimen: BLOOD  Result Value Ref Range   Specimen Description      BLOOD Blood Culture adequate volume Performed at [facility], [address] [place], [place] [address]    Special Requests      BOTTLES DRAWN AEROBIC  AND ANAEROBIC RIGHT ANTECUBITAL Performed at [facility], [place] [place], [place] [address]    Culture      NO GROWTH 2 DAYS Performed at [facility], [place] [address], [place], [place] [address]    Report Status PENDING   Resp Panel by RT-PCR (Flu A&B, Covid) Peripheral     Status: None   Collection Time: 04/30/21  2:49 PM   Specimen: Peripheral; Nasopharyngeal(NP) swabs in vial transport medium  Result Value Ref Range   SARS Coronavirus 2 by RT PCR NEGATIVE NEGATIVE    Comment: (NOTE) SARS-CoV-2 target nucleic acids are NOT DETECTED.  The SARS-CoV-2 RNA is generally detectable in upper respiratory specimens during the acute phase of infection. The lowest concentration of SARS-CoV-2 viral copies this assay can detect is 138 copies/mL. A negative result does not preclude SARS-Cov-2 infection and should not be used as the sole basis for treatment or other patient management decisions. A  negative result may occur with  improper specimen collection/handling, submission of specimen other than nasopharyngeal swab, presence of viral mutation(s) within the areas targeted by this assay, and inadequate number of viral copies(<138 copies/mL). A negative result must be combined with clinical observations, patient history, and epidemiological information. The expected result is Negative.  Fact Sheet for Patients:  EntrepreneurPulse.com.au  Fact Sheet for Healthcare Providers:  IncredibleEmployment.be  This test is no t yet approved or cleared by the [place] FDA and  has been authorized for detection and/or diagnosis of SARS-CoV-2 by FDA under an Emergency Use Authorization (EUA). This EUA will remain  in effect (meaning this test can be used) for the duration of the COVID-19 declaration under Section 564(b)(1) of the Act, 21 U.S.C.section 360bbb-3(b)(1), unless the authorization is terminated  or revoked sooner.       Influenza A by PCR NEGATIVE NEGATIVE   Influenza B by PCR NEGATIVE NEGATIVE    Comment: (NOTE) The Xpert Xpress SARS-CoV-2/FLU/RSV plus assay is intended as an aid in the diagnosis of influenza from Nasopharyngeal swab specimens and should not be used as a sole basis for treatment. Nasal washings and aspirates are unacceptable for Xpert Xpress SARS-CoV-2/FLU/RSV testing.  Fact Sheet for Patients: EntrepreneurPulse.com.au  Fact Sheet for Healthcare Providers: IncredibleEmployment.be  This test is not yet approved or cleared by the [place] FDA and has been authorized for detection and/or diagnosis of SARS-CoV-2 by FDA under an Emergency Use Authorization (EUA). This EUA will remain in effect (meaning this test can be used) for the duration of the COVID-19 declaration under Section 564(b)(1) of the Act, 21 U.S.C. section 360bbb-3(b)(1), unless the authorization is terminated  or revoked.  Performed at [facility], [address] [place], [place] [address]   Lactic acid, plasma     Status: None   Collection Time: 04/30/21  6:03 PM  Result Value Ref Range   Lactic Acid, Venous 1.5 0.5 - 1.9 mmol/L    Comment: Performed at [facility], [place] [address] [place], [place] [address]  Gastrointestinal Panel by PCR , Stool     Status: None   Collection Time: 04/30/21 11:13 PM   Specimen: STOOL  Result Value Ref Range   Campylobacter species NOT DETECTED NOT DETECTED   Plesimonas shigelloides NOT DETECTED NOT DETECTED   Salmonella species NOT DETECTED NOT DETECTED   Yersinia enterocolitica NOT DETECTED NOT DETECTED   Vibrio species NOT DETECTED NOT DETECTED   Vibrio cholerae NOT DETECTED NOT DETECTED  Enteroaggregative E coli (EAEC) NOT DETECTED NOT DETECTED   Enteropathogenic E coli (EPEC) NOT DETECTED NOT DETECTED   Enterotoxigenic E coli (ETEC) NOT DETECTED NOT DETECTED   Shiga like toxin producing E coli (STEC) NOT DETECTED NOT DETECTED   Shigella/Enteroinvasive E coli (EIEC) NOT DETECTED NOT DETECTED   Cryptosporidium NOT DETECTED NOT DETECTED   Cyclospora cayetanensis NOT DETECTED NOT DETECTED   Entamoeba histolytica NOT DETECTED NOT DETECTED   Giardia lamblia NOT DETECTED NOT DETECTED   Adenovirus F40/41 NOT DETECTED NOT DETECTED   Astrovirus NOT DETECTED NOT DETECTED   Norovirus GI/GII NOT DETECTED NOT DETECTED   Rotavirus A NOT DETECTED NOT DETECTED   Sapovirus (I, II, IV, and V) NOT DETECTED NOT DETECTED    Comment: Performed at [facility], [address] [place], [place] [address]  C Difficile Quick Screen w PCR reflex     Status: None   Collection Time: 04/30/21 11:13 PM  Result Value Ref Range   C Diff antigen NEGATIVE NEGATIVE   C Diff toxin NEGATIVE NEGATIVE   C Diff interpretation NEGATIVE     Comment: Performed at [facility], [place] [address] [place], [place] [address]   Occult blood card to lab, stool     Status: None   Collection Time: 04/30/21 11:13 PM  Result Value Ref Range   Fecal Occult Bld NEGATIVE NEGATIVE    Comment: Performed at [facility], [place] [address], [place], [place] [address]  Protime-INR     Status: Abnormal   Collection Time: 05/01/21  5:05 AM  Result Value Ref Range   Prothrombin Time 18.3 (H) 11.4 - 15.2 seconds   INR 1.5 (H) 0.8 - 1.2    Comment: (NOTE) INR goal varies based on device and disease states. Performed at [facility], [place] [address], [place], [place] [address]   Cortisol-am, blood     Status: None   Collection Time: 05/01/21  5:05 AM  Result Value Ref Range   Cortisol - AM 15.1 6.7 - 22.6 ug/dL    Comment: Performed at [facility], [place] [address] [place], [place] [address]  Procalcitonin     Status: None   Collection Time: 05/01/21  5:05 AM  Result Value Ref Range   Procalcitonin 6.30 ng/mL    Comment:        Interpretation: PCT > 2 ng/mL: Systemic infection (sepsis) is likely, unless other causes are known. (NOTE)       Sepsis PCT Algorithm           Lower Respiratory Tract                                      Infection PCT Algorithm    ----------------------------     ----------------------------         PCT < 0.25 ng/mL                PCT < 0.10 ng/mL          Strongly encourage             Strongly discourage   discontinuation of antibiotics    initiation of antibiotics    ----------------------------     -----------------------------       PCT 0.25 - 0.50 ng/mL            PCT 0.10 - 0.25 ng/mL  OR       >80% decrease in PCT            Discourage initiation of                                            antibiotics      Encourage discontinuation           of antibiotics    ----------------------------     -----------------------------         PCT >= 0.50 ng/mL              PCT 0.26 - 0.50 ng/mL               AND       <80% decrease in  PCT              Encourage initiation of                                             antibiotics       Encourage continuation           of antibiotics    ----------------------------     -----------------------------        PCT >= 0.50 ng/mL                  PCT > 0.50 ng/mL               AND         increase in PCT                  Strongly encourage                                      initiation of antibiotics    Strongly encourage escalation           of antibiotics                                     -----------------------------                                           PCT <= 0.25 ng/mL                                                 OR                                        > 80% decrease in PCT                                      Discontinue / Do not initiate  antibiotics  Performed at [facility], [place] [address] [place], [place] [id number]   Basic metabolic panel     Status: None   Collection Time: 05/01/21  5:05 AM  Result Value Ref Range   Sodium 137 135 - 145 mmol/L   Potassium 3.5 3.5 - 5.1 mmol/L   Chloride 106 98 - 111 mmol/L   CO2 22 22 - 32 mmol/L   Glucose, Bld 79 70 - 99 mg/dL    Comment: Glucose reference range applies only to samples taken after fasting for at least 8 hours.   BUN 10 6 - 20 mg/dL   Creatinine, Ser 0.82 0.44 - 1.00 mg/dL   Calcium 9.0 8.9 - 10.3 mg/dL   GFR, Estimated >60 >60 mL/min    Comment: (NOTE) Calculated using the CKD-EPI Creatinine Equation (2021)    Anion gap 9 5 - 15    Comment: Performed at [facility], [place] [address] [place], [place] [address]  CBC with Differential/Platelet     Status: Abnormal   Collection Time: 05/01/21  5:05 AM  Result Value Ref Range   WBC 20.3 (H) 4.0 - 10.5 K/uL   RBC 3.95 3.87 - 5.11 MIL/uL   Hemoglobin 12.7 12.0 - 15.0 g/dL   HCT 37.4 36.0 - 46.0 %   MCV 94.7 80.0 - 100.0 fL   MCH 32.2 26.0 - 34.0 pg   MCHC  34.0 30.0 - 36.0 g/dL   RDW 12.8 11.5 - 15.5 %   Platelets 195 150 - 400 K/uL   nRBC 0.0 0.0 - 0.2 %   Neutrophils Relative % 88 %   Neutro Abs 17.5 (H) 1.7 - 7.7 K/uL   Lymphocytes Relative 7 %   Lymphs Abs 1.4 0.7 - 4.0 K/uL   Monocytes Relative 4 %   Monocytes Absolute 0.9 0.1 - 1.0 K/uL   Eosinophils Relative 0 %   Eosinophils Absolute 0.1 0.0 - 0.5 K/uL   Basophils Relative 0 %   Basophils Absolute 0.1 0.0 - 0.1 K/uL   Immature Granulocytes 1 %   Abs Immature Granulocytes 0.25 (H) 0.00 - 0.07 K/uL    Comment: Performed at [facility], [place] [address] [place], [place] [address]  Magnesium     Status: None   Collection Time: 05/01/21  5:05 AM  Result Value Ref Range   Magnesium 2.3 1.7 - 2.4 mg/dL    Comment: Performed at [facility], [place], [place] [address], [place], [place] [address]  Urine rapid drug screen (hosp performed)     Status: Abnormal   Collection Time: 05/01/21  9:03 AM  Result Value Ref Range   Opiates NONE DETECTED NONE DETECTED   Cocaine NONE DETECTED NONE DETECTED   Benzodiazepines NONE DETECTED NONE DETECTED   Amphetamines NONE DETECTED NONE DETECTED   Tetrahydrocannabinol POSITIVE (A) NONE DETECTED   Barbiturates NONE DETECTED NONE DETECTED    Comment: (NOTE) DRUG SCREEN FOR MEDICAL PURPOSES ONLY.  IF CONFIRMATION IS NEEDED FOR ANY PURPOSE, NOTIFY LAB WITHIN 5 DAYS.  LOWEST DETECTABLE LIMITS FOR URINE DRUG SCREEN Drug Class                     Cutoff (ng/mL) Amphetamine and metabolites    1000 Barbiturate and metabolites    200 Benzodiazepine                 [id number] Tricyclics and metabolites     300 Opiates and metabolites  300 Cocaine and metabolites        300 THC                            50 Performed at [facility], [place] [address], [place], [place] [id number]   Basic metabolic panel     Status: Abnormal   Collection Time: 05/02/21  5:06 AM  Result Value Ref Range   Sodium 136 135 -  145 mmol/L   Potassium 3.3 (L) 3.5 - 5.1 mmol/L   Chloride 108 98 - 111 mmol/L   CO2 19 (L) 22 - 32 mmol/L   Glucose, Bld 81 70 - 99 mg/dL    Comment: Glucose reference range applies only to samples taken after fasting for at least 8 hours.   BUN 9 6 - 20 mg/dL   Creatinine, Ser 0.63 0.44 - 1.00 mg/dL   Calcium 8.6 (L) 8.9 - 10.3 mg/dL   GFR, Estimated >60 >60 mL/min    Comment: (NOTE) Calculated using the CKD-EPI Creatinine Equation (2021)    Anion gap 9 5 - 15    Comment: Performed at [facility], [place] [address], [place], [place] [address]  CBC with Differential/Platelet     Status: Abnormal   Collection Time: 05/02/21  5:06 AM  Result Value Ref Range   WBC 15.7 (H) 4.0 - 10.5 K/uL   RBC 3.62 (L) 3.87 - 5.11 MIL/uL   Hemoglobin 11.6 (L) 12.0 - 15.0 g/dL   HCT 33.4 (L) 36.0 - 46.0 %   MCV 92.3 80.0 - 100.0 fL   MCH 32.0 26.0 - 34.0 pg   MCHC 34.7 30.0 - 36.0 g/dL   RDW 12.7 11.5 - 15.5 %   Platelets 205 150 - 400 K/uL   nRBC 0.0 0.0 - 0.2 %   Neutrophils Relative % 81 %   Neutro Abs 12.9 (H) 1.7 - 7.7 K/uL   Lymphocytes Relative 10 %   Lymphs Abs 1.5 0.7 - 4.0 K/uL   Monocytes Relative 7 %   Monocytes Absolute 1.1 (H) 0.1 - 1.0 K/uL   Eosinophils Relative 1 %   Eosinophils Absolute 0.1 0.0 - 0.5 K/uL   Basophils Relative 0 %   Basophils Absolute 0.0 0.0 - 0.1 K/uL   Immature Granulocytes 1 %   Abs Immature Granulocytes 0.12 (H) 0.00 - 0.07 K/uL    Comment: Performed at [facility], [place] [address], [place], [place] [address]  Magnesium     Status: None   Collection Time: 05/02/21  5:06 AM  Result Value Ref Range   Magnesium 2.1 1.7 - 2.4 mg/dL    Comment: Performed at [facility], [place] [address] [place], [place] [address]    Studies/Results: DG Chest Port 1 View  Result Date: 04/30/2021 CLINICAL DATA:  Chest pain, vomiting EXAM: PORTABLE CHEST 1 VIEW COMPARISON:  08/12/2020 FINDINGS: The heart size and  mediastinal contours are within normal limits. Both lungs are clear. The visualized skeletal structures are unremarkable. IMPRESSION: No active disease. Electronically Signed   By: Elmer Picker M.D.   On: 04/30/2021 13:26    Medications: I have reviewed the patient's current medications.  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 difficile and GI pathogen panel, elevated leukocytosis, blood in stool and family history of Crohn's disease Fecal calprotectin pending  Plan: Diagnostic  colonoscopy in a.m. Clear liquid diet, colonic prep orders written The risks and the benefits of the procedure were discussed with the patient in details. She understands and verbalizes consent.  Kerin Salen, MD 05/02/2021, 1:10 PM

## 2021-05-02 NOTE — H&P (View-Only) (Signed)
Subjective: Patient had to take Imodium for diarrhea yesterday and had an episode of fecal incontinence today. She continues to have lower abdominal discomfort. She reports improvement in nausea and vomiting.  Objective: Vital signs in last 24 hours: Temp:  [98.3 F (36.8 C)-100.5 F (38.1 C)] 98.3 F (36.8 C) (12/28 0516) Pulse Rate:  [70-83] 80 (12/28 0516) Resp:  [18] 18 (12/28 0516) BP: (99-102)/(61-68) 99/65 (12/28 0516) SpO2:  [100 %] 100 % (12/28 0516) Weight change:  Last BM Date: 05/01/21  PE: Thin, not in distress, moist mucous membranes GENERAL: Mild pallor, able to speak in full sentences and not in respiratory distress ABDOMEN: Nondistended EXTREMITIES: No edema  Lab Results: Results for orders placed or performed during the hospital encounter of 04/30/21 (from the past 48 hour(s))  CBC with Differential     Status: Abnormal   Collection Time: 04/30/21  1:30 PM  Result Value Ref Range   WBC 29.1 (H) 4.0 - 10.5 K/uL   RBC 3.75 (L) 3.87 - 5.11 MIL/uL   Hemoglobin 12.0 12.0 - 15.0 g/dL   HCT 34.8 (L) 36.0 - 46.0 %   MCV 92.8 80.0 - 100.0 fL   MCH 32.0 26.0 - 34.0 pg   MCHC 34.5 30.0 - 36.0 g/dL   RDW 12.8 11.5 - 15.5 %   Platelets 195 150 - 400 K/uL   nRBC 0.0 0.0 - 0.2 %   Neutrophils Relative % 90 %   Neutro Abs 25.5 (H) 1.7 - 7.7 K/uL   Lymphocytes Relative 3 %   Lymphs Abs 1.0 0.7 - 4.0 K/uL   Monocytes Relative 4 %   Monocytes Absolute 1.0 0.1 - 1.0 K/uL   Eosinophils Relative 0 %   Eosinophils Absolute 0.0 0.0 - 0.5 K/uL   Basophils Relative 0 %   Basophils Absolute 0.1 0.0 - 0.1 K/uL   Immature Granulocytes 3 %   Abs Immature Granulocytes 0.88 (H) 0.00 - 0.07 K/uL    Comment: Performed at [facility], [address] [place], [place] [address]  Culture, blood (routine x 2)     Status: None (Preliminary result)   Collection Time: 04/30/21  2:29 PM   Specimen: BLOOD  Result Value Ref Range   Specimen Description      BLOOD Blood  Culture adequate volume Performed at [facility], [address] [place], [place] [address]    Special Requests      BOTTLES DRAWN AEROBIC AND ANAEROBIC LEFT ANTECUBITAL Performed at [facility], [address] [place], [place] [address]    Culture      NO GROWTH 2 DAYS Performed at [facility], [address], [place], [place] [address]    Report Status PENDING   Lactic acid, plasma     Status: None   Collection Time: 04/30/21  2:29 PM  Result Value Ref Range   Lactic Acid, Venous 0.9 0.5 - 1.9 mmol/L    Comment: Performed at [facility], [address] [place], [place] [address]  Culture, blood (routine x 2)     Status: None (Preliminary result)   Collection Time: 04/30/21  2:47 PM   Specimen: BLOOD  Result Value Ref Range   Specimen Description      BLOOD Blood Culture adequate volume Performed at [facility], [address] [place], [place] [address]    Special Requests      BOTTLES DRAWN AEROBIC  AND ANAEROBIC RIGHT ANTECUBITAL Performed at [facility], [address] [place], [place] [address]    Culture      NO GROWTH 2 DAYS Performed at [facility], [place] [address], [place], [place] [address]    Report Status PENDING   Resp Panel by RT-PCR (Flu A&B, Covid) Peripheral     Status: None   Collection Time: 04/30/21  2:49 PM   Specimen: Peripheral; Nasopharyngeal(NP) swabs in vial transport medium  Result Value Ref Range   SARS Coronavirus 2 by RT PCR NEGATIVE NEGATIVE    Comment: (NOTE) SARS-CoV-2 target nucleic acids are NOT DETECTED.  The SARS-CoV-2 RNA is generally detectable in upper respiratory specimens during the acute phase of infection. The lowest concentration of SARS-CoV-2 viral copies this assay can detect is 138 copies/mL. A negative result does not preclude SARS-Cov-2 infection and should not be used as the sole basis for treatment or other patient management decisions. A  negative result may occur with  improper specimen collection/handling, submission of specimen other than nasopharyngeal swab, presence of viral mutation(s) within the areas targeted by this assay, and inadequate number of viral copies(<138 copies/mL). A negative result must be combined with clinical observations, patient history, and epidemiological information. The expected result is Negative.  Fact Sheet for Patients:  EntrepreneurPulse.com.au  Fact Sheet for Healthcare Providers:  IncredibleEmployment.be  This test is no t yet approved or cleared by the [place] FDA and  has been authorized for detection and/or diagnosis of SARS-CoV-2 by FDA under an Emergency Use Authorization (EUA). This EUA will remain  in effect (meaning this test can be used) for the duration of the COVID-19 declaration under Section 564(b)(1) of the Act, 21 U.S.C.section 360bbb-3(b)(1), unless the authorization is terminated  or revoked sooner.       Influenza A by PCR NEGATIVE NEGATIVE   Influenza B by PCR NEGATIVE NEGATIVE    Comment: (NOTE) The Xpert Xpress SARS-CoV-2/FLU/RSV plus assay is intended as an aid in the diagnosis of influenza from Nasopharyngeal swab specimens and should not be used as a sole basis for treatment. Nasal washings and aspirates are unacceptable for Xpert Xpress SARS-CoV-2/FLU/RSV testing.  Fact Sheet for Patients: EntrepreneurPulse.com.au  Fact Sheet for Healthcare Providers: IncredibleEmployment.be  This test is not yet approved or cleared by the [place] FDA and has been authorized for detection and/or diagnosis of SARS-CoV-2 by FDA under an Emergency Use Authorization (EUA). This EUA will remain in effect (meaning this test can be used) for the duration of the COVID-19 declaration under Section 564(b)(1) of the Act, 21 U.S.C. section 360bbb-3(b)(1), unless the authorization is terminated  or revoked.  Performed at [facility], [address] [place], [place] [address]   Lactic acid, plasma     Status: None   Collection Time: 04/30/21  6:03 PM  Result Value Ref Range   Lactic Acid, Venous 1.5 0.5 - 1.9 mmol/L    Comment: Performed at [facility], [place] [address] [place], [place] [address]  Gastrointestinal Panel by PCR , Stool     Status: None   Collection Time: 04/30/21 11:13 PM   Specimen: STOOL  Result Value Ref Range   Campylobacter species NOT DETECTED NOT DETECTED   Plesimonas shigelloides NOT DETECTED NOT DETECTED   Salmonella species NOT DETECTED NOT DETECTED   Yersinia enterocolitica NOT DETECTED NOT DETECTED   Vibrio species NOT DETECTED NOT DETECTED   Vibrio cholerae NOT DETECTED NOT DETECTED  Enteroaggregative E coli (EAEC) NOT DETECTED NOT DETECTED   Enteropathogenic E coli (EPEC) NOT DETECTED NOT DETECTED   Enterotoxigenic E coli (ETEC) NOT DETECTED NOT DETECTED   Shiga like toxin producing E coli (STEC) NOT DETECTED NOT DETECTED   Shigella/Enteroinvasive E coli (EIEC) NOT DETECTED NOT DETECTED   Cryptosporidium NOT DETECTED NOT DETECTED   Cyclospora cayetanensis NOT DETECTED NOT DETECTED   Entamoeba histolytica NOT DETECTED NOT DETECTED   Giardia lamblia NOT DETECTED NOT DETECTED   Adenovirus F40/41 NOT DETECTED NOT DETECTED   Astrovirus NOT DETECTED NOT DETECTED   Norovirus GI/GII NOT DETECTED NOT DETECTED   Rotavirus A NOT DETECTED NOT DETECTED   Sapovirus (I, II, IV, and V) NOT DETECTED NOT DETECTED    Comment: Performed at [facility], [address] [place], [place] [address]  C Difficile Quick Screen w PCR reflex     Status: None   Collection Time: 04/30/21 11:13 PM  Result Value Ref Range   C Diff antigen NEGATIVE NEGATIVE   C Diff toxin NEGATIVE NEGATIVE   C Diff interpretation NEGATIVE     Comment: Performed at [facility], [place] [address] [place], [place] [address]   Occult blood card to lab, stool     Status: None   Collection Time: 04/30/21 11:13 PM  Result Value Ref Range   Fecal Occult Bld NEGATIVE NEGATIVE    Comment: Performed at [facility], [place] [address] [place], [place] [address]  Protime-INR     Status: Abnormal   Collection Time: 05/01/21  5:05 AM  Result Value Ref Range   Prothrombin Time 18.3 (H) 11.4 - 15.2 seconds   INR 1.5 (H) 0.8 - 1.2    Comment: (NOTE) INR goal varies based on device and disease states. Performed at [facility], [place] [address] [place], [place] [address]   Cortisol-am, blood     Status: None   Collection Time: 05/01/21  5:05 AM  Result Value Ref Range   Cortisol - AM 15.1 6.7 - 22.6 ug/dL    Comment: Performed at [facility], [place] [address] [place], [place] [address]  Procalcitonin     Status: None   Collection Time: 05/01/21  5:05 AM  Result Value Ref Range   Procalcitonin 6.30 ng/mL    Comment:        Interpretation: PCT > 2 ng/mL: Systemic infection (sepsis) is likely, unless other causes are known. (NOTE)       Sepsis PCT Algorithm           Lower Respiratory Tract                                      Infection PCT Algorithm    ----------------------------     ----------------------------         PCT < 0.25 ng/mL                PCT < 0.10 ng/mL          Strongly encourage             Strongly discourage   discontinuation of antibiotics    initiation of antibiotics    ----------------------------     -----------------------------       PCT 0.25 - 0.50 ng/mL            PCT 0.10 - 0.25 ng/mL  OR       >80% decrease in PCT            Discourage initiation of                                            antibiotics      Encourage discontinuation           of antibiotics    ----------------------------     -----------------------------         PCT >= 0.50 ng/mL              PCT 0.26 - 0.50 ng/mL               AND       <80% decrease in  PCT              Encourage initiation of                                             antibiotics       Encourage continuation           of antibiotics    ----------------------------     -----------------------------        PCT >= 0.50 ng/mL                  PCT > 0.50 ng/mL               AND         increase in PCT                  Strongly encourage                                      initiation of antibiotics    Strongly encourage escalation           of antibiotics                                     -----------------------------                                           PCT <= 0.25 ng/mL                                                 OR                                        > 80% decrease in PCT                                      Discontinue / Do not initiate  antibiotics ° °Performed at [facility], [address] °[place], [place] [address] °  °Basic metabolic panel     Status: None  ° Collection Time: 05/01/21  5:05 AM  °Result Value Ref Range  ° Sodium 137 135 - 145 mmol/L  ° Potassium 3.5 3.5 - 5.1 mmol/L  ° Chloride 106 98 - 111 mmol/L  ° CO2 22 22 - 32 mmol/L  ° Glucose, Bld 79 70 - 99 mg/dL  °  Comment: Glucose reference range applies only to samples taken after fasting for at least 8 hours.  ° BUN 10 6 - 20 mg/dL  ° Creatinine, Ser 0.82 0.44 - 1.00 mg/dL  ° Calcium 9.0 8.9 - 10.3 mg/dL  ° GFR, Estimated >60 >60 mL/min  °  Comment: (NOTE) °Calculated using the CKD-EPI Creatinine Equation (2021) °  ° Anion gap 9 5 - 15  °  Comment: Performed at [facility], [address] [place], [place] [address]  °CBC with Differential/Platelet     Status: Abnormal  ° Collection Time: 05/01/21  5:05 AM  °Result Value Ref Range  ° WBC 20.3 (H) 4.0 - 10.5 K/uL  ° RBC 3.95 3.87 - 5.11 MIL/uL  ° Hemoglobin 12.7 12.0 - 15.0 g/dL  ° HCT 37.4 36.0 - 46.0 %  ° MCV 94.7 80.0 - 100.0 fL  ° MCH 32.2 26.0 - 34.0 pg  ° MCHC  34.0 30.0 - 36.0 g/dL  ° RDW 12.8 11.5 - 15.5 %  ° Platelets 195 150 - 400 K/uL  ° nRBC 0.0 0.0 - 0.2 %  ° Neutrophils Relative % 88 %  ° Neutro Abs 17.5 (H) 1.7 - 7.7 K/uL  ° Lymphocytes Relative 7 %  ° Lymphs Abs 1.4 0.7 - 4.0 K/uL  ° Monocytes Relative 4 %  ° Monocytes Absolute 0.9 0.1 - 1.0 K/uL  ° Eosinophils Relative 0 %  ° Eosinophils Absolute 0.1 0.0 - 0.5 K/uL  ° Basophils Relative 0 %  ° Basophils Absolute 0.1 0.0 - 0.1 K/uL  ° Immature Granulocytes 1 %  ° Abs Immature Granulocytes 0.25 (H) 0.00 - 0.07 K/uL  °  Comment: Performed at [facility], [address] [place], [place] [address]  °Magnesium     Status: None  ° Collection Time: 05/01/21  5:05 AM  °Result Value Ref Range  ° Magnesium 2.3 1.7 - 2.4 mg/dL  °  Comment: Performed at [facility], [address] [place], [place] [address]  °Urine rapid drug screen (hosp performed)     Status: Abnormal  ° Collection Time: 05/01/21  9:03 AM  °Result Value Ref Range  ° Opiates NONE DETECTED NONE DETECTED  ° Cocaine NONE DETECTED NONE DETECTED  ° Benzodiazepines NONE DETECTED NONE DETECTED  ° Amphetamines NONE DETECTED NONE DETECTED  ° Tetrahydrocannabinol POSITIVE (A) NONE DETECTED  ° Barbiturates NONE DETECTED NONE DETECTED  °  Comment: (NOTE) °DRUG SCREEN FOR MEDICAL PURPOSES °ONLY.  IF CONFIRMATION IS NEEDED °FOR ANY PURPOSE, NOTIFY LAB °WITHIN 5 DAYS. ° °LOWEST DETECTABLE LIMITS °FOR URINE DRUG SCREEN °Drug Class                     Cutoff (ng/mL) °Amphetamine and metabolites    1000 °Barbiturate and metabolites    200 °Benzodiazepine                 200 °Tricyclics and metabolites     300 °Opiates and metabolites          300 Cocaine and metabolites        300 THC                            50 Performed at [facility], [place] [address], [place], [place] [id number]   Basic metabolic panel     Status: Abnormal   Collection Time: 05/02/21  5:06 AM  Result Value Ref Range   Sodium 136 135 -  145 mmol/L   Potassium 3.3 (L) 3.5 - 5.1 mmol/L   Chloride 108 98 - 111 mmol/L   CO2 19 (L) 22 - 32 mmol/L   Glucose, Bld 81 70 - 99 mg/dL    Comment: Glucose reference range applies only to samples taken after fasting for at least 8 hours.   BUN 9 6 - 20 mg/dL   Creatinine, Ser 0.63 0.44 - 1.00 mg/dL   Calcium 8.6 (L) 8.9 - 10.3 mg/dL   GFR, Estimated >60 >60 mL/min    Comment: (NOTE) Calculated using the CKD-EPI Creatinine Equation (2021)    Anion gap 9 5 - 15    Comment: Performed at [facility], [place] [address] [place] [address] [place], [place] [address]  CBC with Differential/Platelet     Status: Abnormal   Collection Time: 05/02/21  5:06 AM  Result Value Ref Range   WBC 15.7 (H) 4.0 - 10.5 K/uL   RBC 3.62 (L) 3.87 - 5.11 MIL/uL   Hemoglobin 11.6 (L) 12.0 - 15.0 g/dL   HCT 33.4 (L) 36.0 - 46.0 %   MCV 92.3 80.0 - 100.0 fL   MCH 32.0 26.0 - 34.0 pg   MCHC 34.7 30.0 - 36.0 g/dL   RDW 12.7 11.5 - 15.5 %   Platelets 205 150 - 400 K/uL   nRBC 0.0 0.0 - 0.2 %   Neutrophils Relative % 81 %   Neutro Abs 12.9 (H) 1.7 - 7.7 K/uL   Lymphocytes Relative 10 %   Lymphs Abs 1.5 0.7 - 4.0 K/uL   Monocytes Relative 7 %   Monocytes Absolute 1.1 (H) 0.1 - 1.0 K/uL   Eosinophils Relative 1 %   Eosinophils Absolute 0.1 0.0 - 0.5 K/uL   Basophils Relative 0 %   Basophils Absolute 0.0 0.0 - 0.1 K/uL   Immature Granulocytes 1 %   Abs Immature Granulocytes 0.12 (H) 0.00 - 0.07 K/uL    Comment: Performed at [facility], [place] [address] [place], [place] [address]  Magnesium     Status: None   Collection Time: 05/02/21  5:06 AM  Result Value Ref Range   Magnesium 2.1 1.7 - 2.4 mg/dL    Comment: Performed at [facility], [place] [address], [place], [place] [address]    Studies/Results: DG Chest Port 1 View  Result Date: 04/30/2021 CLINICAL DATA:  Chest pain, vomiting EXAM: PORTABLE CHEST 1 VIEW COMPARISON:  08/12/2020 FINDINGS: The heart size and  mediastinal contours are within normal limits. Both lungs are clear. The visualized skeletal structures are unremarkable. IMPRESSION: No active disease. Electronically Signed   By: Elmer Picker M.D.   On: 04/30/2021 13:26    Medications: I have reviewed the patient's current medications.  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 difficile and GI pathogen panel, elevated leukocytosis, blood in stool and family history of Crohn's disease Fecal calprotectin pending  Plan: Diagnostic  colonoscopy in a.m. Clear liquid diet, colonic prep orders written The risks and the benefits of the procedure were discussed with the patient in details. She understands and verbalizes consent.  Kerin Salen, MD 05/02/2021, 1:10 PM

## 2021-05-02 NOTE — Plan of Care (Signed)
°  Problem: Clinical Measurements: Goal: Ability to maintain clinical measurements within normal limits will improve Outcome: Progressing Goal: Will remain free from infection Outcome: Progressing Goal: Diagnostic test results will improve Outcome: Progressing Goal: Respiratory complications will improve Outcome: Progressing Goal: Cardiovascular complication will be avoided Outcome: Progressing   Problem: Activity: Goal: Risk for activity intolerance will decrease Outcome: Progressing   Problem: Nutrition: Goal: Adequate nutrition will be maintained Outcome: Progressing   Problem: Coping: Goal: Level of anxiety will decrease Outcome: Progressing   Problem: Pain Managment: Goal: General experience of comfort will improve Outcome: Progressing   Problem: Safety: Goal: Ability to remain free from injury will improve Outcome: Progressing   Problem: Skin Integrity: Goal: Risk for impaired skin integrity will decrease Outcome: Progressing   Problem: Nutrition Goal: Patient maintains adequate hydration Outcome: Progressing Goal: Patient maintains weight Outcome: Progressing Goal: Patient/Family demonstrates understanding of diet Outcome: Progressing Goal: Patient/Family independently completes tube feeding Outcome: Progressing Goal: Patient will have no more than 5 lb weight change during LOS Outcome: Progressing Goal: Patient will utilize adaptive techniques to administer nutrition Outcome: Progressing Goal: Patient will verbalize dietary restrictions Outcome: Progressing

## 2021-05-02 NOTE — Plan of Care (Signed)
  Problem: Education: Goal: Knowledge of General Education information will improve Description Including pain rating scale, medication(s)/side effects and non-pharmacologic comfort measures Outcome: Progressing   

## 2021-05-03 ENCOUNTER — Inpatient Hospital Stay (HOSPITAL_COMMUNITY): Payer: Medicaid Other | Admitting: Anesthesiology

## 2021-05-03 ENCOUNTER — Encounter (HOSPITAL_COMMUNITY)
Admission: EM | Disposition: A | Discharge status: Home / Self Care | Payer: Self-pay | Source: Home / Self Care | Attending: Internal Medicine

## 2021-05-03 ENCOUNTER — Encounter (HOSPITAL_COMMUNITY): Payer: Self-pay | Admitting: Internal Medicine

## 2021-05-03 DIAGNOSIS — N390 Urinary tract infection, site not specified: Secondary | ICD-10-CM | POA: Diagnosis not present

## 2021-05-03 DIAGNOSIS — R109 Unspecified abdominal pain: Secondary | ICD-10-CM | POA: Diagnosis not present

## 2021-05-03 DIAGNOSIS — E876 Hypokalemia: Secondary | ICD-10-CM | POA: Diagnosis not present

## 2021-05-03 HISTORY — PX: BIOPSY: SHX5522

## 2021-05-03 HISTORY — PX: COLONOSCOPY WITH PROPOFOL: SHX5780

## 2021-05-03 LAB — MAGNESIUM: Magnesium: 2 mg/dL (ref 1.7–2.4)

## 2021-05-03 LAB — CBC WITH DIFFERENTIAL/PLATELET
Abs Immature Granulocytes: 0.04 10*3/uL (ref 0.00–0.07)
Basophils Absolute: 0 10*3/uL (ref 0.0–0.1)
Basophils Relative: 0 %
Eosinophils Absolute: 0.1 10*3/uL (ref 0.0–0.5)
Eosinophils Relative: 1 %
HCT: 31.4 % — ABNORMAL LOW (ref 36.0–46.0)
Hemoglobin: 10.8 g/dL — ABNORMAL LOW (ref 12.0–15.0)
Immature Granulocytes: 0 %
Lymphocytes Relative: 19 %
Lymphs Abs: 1.9 10*3/uL (ref 0.7–4.0)
MCH: 31.8 pg (ref 26.0–34.0)
MCHC: 34.4 g/dL (ref 30.0–36.0)
MCV: 92.4 fL (ref 80.0–100.0)
Monocytes Absolute: 0.9 10*3/uL (ref 0.1–1.0)
Monocytes Relative: 9 %
Neutro Abs: 6.9 10*3/uL (ref 1.7–7.7)
Neutrophils Relative %: 71 %
Platelets: 223 10*3/uL (ref 150–400)
RBC: 3.4 MIL/uL — ABNORMAL LOW (ref 3.87–5.11)
RDW: 12.7 % (ref 11.5–15.5)
WBC: 9.9 10*3/uL (ref 4.0–10.5)
nRBC: 0 % (ref 0.0–0.2)

## 2021-05-03 LAB — BASIC METABOLIC PANEL
Anion gap: 10 (ref 5–15)
BUN: <5 mg/dL — ABNORMAL LOW (ref 6–20)
CO2: 22 mmol/L (ref 22–32)
Calcium: 8.6 mg/dL — ABNORMAL LOW (ref 8.9–10.3)
Chloride: 107 mmol/L (ref 98–111)
Creatinine, Ser: 0.62 mg/dL (ref 0.44–1.00)
GFR, Estimated: >60 mL/min (ref >60)
Glucose, Bld: 95 mg/dL (ref 70–99)
Potassium: 3.4 mmol/L — ABNORMAL LOW (ref 3.5–5.1)
Sodium: 139 mmol/L (ref 135–145)

## 2021-05-03 LAB — NO BLOOD PRODUCTS

## 2021-05-03 LAB — CALPROTECTIN, FECAL: Calprotectin, Fecal: <16 ug/g (ref 0–120)

## 2021-05-03 SURGERY — COLONOSCOPY WITH PROPOFOL
Anesthesia: Monitor Anesthesia Care

## 2021-05-03 MED ORDER — PROPOFOL 10 MG/ML IV BOLUS
INTRAVENOUS | Status: DC | PRN
  Administered 2021-05-03: 30 mg via INTRAVENOUS

## 2021-05-03 MED ORDER — CEPHALEXIN 500 MG PO CAPS: 500.0000 mg | ORAL_CAPSULE | Freq: Two times a day (BID) | ORAL | 0 refills | Status: AC

## 2021-05-03 MED ORDER — SODIUM CHLORIDE 0.9 % IV SOLN: INTRAVENOUS | Status: DC

## 2021-05-03 MED ORDER — LACTATED RINGERS IV SOLN: INTRAVENOUS | Status: DC

## 2021-05-03 MED ORDER — LOPERAMIDE HCL 2 MG PO TABS: 2.0000 mg | ORAL_TABLET | Freq: Four times a day (QID) | ORAL | 0 refills | Status: AC | PRN

## 2021-05-03 MED ORDER — PROPOFOL 500 MG/50ML IV EMUL
INTRAVENOUS | Status: DC | PRN
  Administered 2021-05-03: 125 ug/kg/min via INTRAVENOUS

## 2021-05-03 MED ORDER — LIDOCAINE HCL (CARDIAC) PF 100 MG/5ML IV SOSY
PREFILLED_SYRINGE | INTRAVENOUS | Status: DC | PRN
  Administered 2021-05-03: 100 mg via INTRAVENOUS

## 2021-05-03 SURGICAL SUPPLY — 22 items

## 2021-05-03 NOTE — Interval H&P Note (Signed)
History and Physical Interval Note: 20/female with bloody stools, abdominal pain , diarrhea and family history of Crohn's disease for a colonoscopy.  05/03/2021 12:52 PM  Patricia [name]  has presented today for colonoscopy, with the diagnosis of diarrhea, blood in stool, abdominal pain.  The various methods of treatment have been discussed with the patient and family. After consideration of risks, benefits and other options for treatment, the patient has consented to  Procedure(s): COLONOSCOPY WITH PROPOFOL (N/A) as a surgical intervention.  The patient's history has been reviewed, patient examined, no change in status, stable for surgery.  I have reviewed the patient's chart and labs.  Questions were answered to the patient's satisfaction.     Kerin Salen

## 2021-05-03 NOTE — Anesthesia Postprocedure Evaluation (Signed)
Anesthesia Post Note  Patient: Patricia [name]  Procedure(s) Performed: COLONOSCOPY WITH PROPOFOL BIOPSY     Patient location during evaluation: PACU Anesthesia Type: MAC Level of consciousness: awake and alert Pain management: pain level controlled Vital Signs Assessment: post-procedure vital signs reviewed and stable Respiratory status: spontaneous breathing, nonlabored ventilation, respiratory function stable and patient connected to nasal cannula oxygen Cardiovascular status: stable and blood pressure returned to baseline Postop Assessment: no apparent nausea or vomiting Anesthetic complications: no   No notable events documented.  Last Vitals:  Vitals:   05/03/21 1340 05/03/21 1347  BP: 119/68 111/60  Pulse: 71 70  Resp: 19 20  Temp:    SpO2: 100% 100%    Last Pain:  Vitals:   05/03/21 1347  TempSrc:   PainSc: 0-No pain                 Rooney Swails S

## 2021-05-03 NOTE — TOC Progression Note (Signed)
Transition of Care [facility]) - Progression Note    Patient Details  Name: Patricia [name] MRN: [id number] Date of Birth: [birth year]  Transition of Care [facility]) CM/SW Contact  Golda Acre, RN Phone Number: 05/03/2021, 8:24 AM  Clinical Narrative:     Plan remains to send patient home with self care.  Expected Discharge Plan: Home/Self Care Barriers to Discharge: Continued Medical Work up  Expected Discharge Plan and Services Expected Discharge Plan: Home/Self Care   Discharge Planning Services: CM Consult   Living arrangements for the past 2 months: Single Family Home                                       Social Determinants of Health (SDOH) Interventions    Readmission Risk Interventions No flowsheet data found.

## 2021-05-03 NOTE — Transfer of Care (Signed)
Immediate Anesthesia Transfer of Care Note  Patient: Patricia [name]  Procedure(s) Performed: COLONOSCOPY WITH PROPOFOL BIOPSY  Patient Location: PACU and Endoscopy Unit  Anesthesia Type:MAC  Level of Consciousness: awake, alert  and oriented  Airway & Oxygen Therapy: Patient Spontanous Breathing  Post-op Assessment: Report given to RN and Post -op Vital signs reviewed and stable  Post vital signs: Reviewed and stable  Last Vitals:  Vitals Value Taken Time  BP    Temp    Pulse    Resp    SpO2      Last Pain:  Vitals:   05/03/21 1207  TempSrc: Oral  PainSc: 7       Patients Stated Pain Goal: 2 (80/32/12 2482)  Complications: No notable events documented.

## 2021-05-03 NOTE — Anesthesia Preprocedure Evaluation (Signed)
Anesthesia Evaluation  Patient identified by MRN, date of birth, ID band Patient awake    Reviewed: Allergy & Precautions, NPO status , Patient's Chart, lab work & pertinent test results  Airway Mallampati: II  TM Distance: >3 FB Neck ROM: Full    Dental no notable dental hx.    Pulmonary neg pulmonary ROS, Current Smoker and Patient abstained from smoking.,    Pulmonary exam normal breath sounds clear to auscultation       Cardiovascular negative cardio ROS Normal cardiovascular exam Rhythm:Regular Rate:Normal     Neuro/Psych negative neurological ROS  negative psych ROS   GI/Hepatic negative GI ROS, Neg liver ROS,   Endo/Other  negative endocrine ROS  Renal/GU negative Renal ROS  negative genitourinary   Musculoskeletal negative musculoskeletal ROS (+)   Abdominal   Peds negative pediatric ROS (+)  Hematology  (+) anemia ,   Anesthesia Other Findings   Reproductive/Obstetrics negative OB ROS                             Anesthesia Physical Anesthesia Plan  ASA: 2  Anesthesia Plan: MAC   Post-op Pain Management:    Induction: Intravenous  PONV Risk Score and Plan: 2 and Propofol infusion and Treatment may vary due to age or medical condition  Airway Management Planned: Simple Face Mask  Additional Equipment:   Intra-op Plan:   Post-operative Plan:   Informed Consent: I have reviewed the patients History and Physical, chart, labs and discussed the procedure including the risks, benefits and alternatives for the proposed anesthesia with the patient or authorized representative who has indicated his/her understanding and acceptance.     Dental advisory given  Plan Discussed with: CRNA and Surgeon  Anesthesia Plan Comments:         Anesthesia Quick Evaluation

## 2021-05-03 NOTE — Op Note (Signed)
[facility] Patient Name: Patricia [name] Procedure Date: 05/03/2021 MRN: [id number] Attending MD: Ronnette Juniper , MD Date of Birth: 2001/04/05 CSN: [id number] Age: 20 Admit Type: Inpatient Procedure:                Colonoscopy Indications:              This is the patient's first colonoscopy,                            Generalized abdominal pain, Clinically significant                            diarrhea of unexplained origin, Rectal bleeding Providers:                Ronnette Juniper, MD, Jeanella Cara, RN, Tyna Jaksch Technician, Stephanie [place] CRNA Referring MD:             [facility] Medicines:                Monitored Anesthesia Care Complications:            No immediate complications. Estimated blood loss:                            Minimal. Estimated Blood Loss:     Estimated blood loss was minimal. Procedure:                Pre-Anesthesia Assessment:                           - Prior to the procedure, a History and Physical                            was performed, and patient medications and                            allergies were reviewed. The patient's tolerance of                            previous anesthesia was also reviewed. The risks                            and benefits of the procedure and the sedation                            options and risks were discussed with the patient.                            All questions were answered, and informed consent                            was obtained. Prior Anticoagulants: The patient has                            taken  no previous anticoagulant or antiplatelet                            agents. ASA Grade Assessment: II - A patient with                            mild systemic disease. After reviewing the risks                            and benefits, the patient was deemed in                            satisfactory condition to undergo the procedure.                            After obtaining informed consent, the colonoscope                            was passed under direct vision. Throughout the                            procedure, the patient's blood pressure, pulse, and                            oxygen saturations were monitored continuously. The                            PCF-HQ190L ([id number] Olympus colonoscope was                            introduced through the anus and advanced to the the                            terminal ileum. The colonoscopy was performed                            without difficulty. The patient tolerated the                            procedure well. The quality of the bowel                            preparation was adequate to identify polyps 6 mm                            and larger in size and fair. Scope In: 1:05:24 PM Scope Out: 1:16:35 PM Scope Withdrawal Time: 0 hours 8 minutes 59 seconds  Total Procedure Duration: 0 hours 11 minutes 11 seconds  Findings:      The perianal and digital rectal examinations were normal.      The terminal ileum appeared normal.      Liquid stool was found in the entire colon, making visualization       difficult. Lavage of the area was performed, resulting in clearance with  fair visualization.      The exam was otherwise without abnormality.      Biopsies for histology were taken with a cold forceps for evaluation of       microscopic colitis.      Non-bleeding internal hemorrhoids were found during retroflexion. The       hemorrhoids were small. Impression:               - Preparation of the colon was fair.                           - The examined portion of the ileum was normal.                           - Liquid stool in the entire examined colon.                           - The examination was otherwise normal.                           - Non-bleeding internal hemorrhoids.                           - Biopsies were taken with a cold forceps for                             evaluation of microscopic colitis. Moderate Sedation:      Patient did not receive moderate sedation for this procedure, but       instead received monitored anesthesia care. Recommendation:           - Resume regular diet.                           - Continue present medications.                           - Await pathology results. Procedure Code(s):        --- Professional ---                           [phone], Colonoscopy, flexible; with biopsy, single                            or multiple Diagnosis Code(s):        --- Professional ---                           K64.8, Other hemorrhoids                           R10.84, Generalized abdominal pain                           R19.7, Diarrhea, unspecified                           K62.5, Hemorrhage of anus and rectum CPT copyright 2019 [employer]. All rights reserved. The codes documented  in this report are preliminary and upon coder review may  be revised to meet current compliance requirements. Ronnette Juniper, MD 05/03/2021 1:25:32 PM This report has been signed electronically. Number of Addenda: 0

## 2021-05-04 ENCOUNTER — Encounter (HOSPITAL_COMMUNITY): Payer: Self-pay | Admitting: Gastroenterology

## 2021-05-04 LAB — SURGICAL PATHOLOGY

## 2021-05-05 LAB — CULTURE, BLOOD (ROUTINE X 2)
Culture: NO GROWTH
Culture: NO GROWTH
Specimen Description: ADEQUATE
Specimen Description: ADEQUATE

## 2021-05-07 NOTE — Discharge Summary (Signed)
Physician Discharge Summary  Deserie Dirks OVA:[id number] DOB: [birth year] DOA: 04/30/2021  PCP: [facility]  Admit date: 04/30/2021 Discharge date: 05/03/2021  Admitted From: Home.  Disposition:  Home.   Recommendations for Outpatient Follow-up:  Follow up with PCP in 1-2 weeks Please obtain BMP/CBC in one week Please follow up with GI regarding the biopsy results.     Discharge Condition:stable.  CODE STATUS:full code.  Diet recommendation: Heart Healthy   Brief/Interim Summary: Ms. Richer is a 21 yo female with PMH allergic rhinitis who presented to med [facility] with ongoing abdominal pain, vomiting, and diarrhea. She was also evaluated for similar symptoms on 04/24/2021 and considered to have a possible UTI and was prescribed Keflex although she has not picked it up since evaluated.  Discharge Diagnoses:  Principal Problem:   Abdominal pain Active Problems:   Acute lower UTI   Hypokalemia   Nausea & vomiting  Abdominal pain In the setting of bloody stools, diarrhea FOBT is negative C. difficile and GI pathogen are both negative GI consulted appreciate recommendations, diagnostic colonoscopy in the morning is wnl. Biopsies done , recommend outpatient follow up. . Slowly advance diet as tolerated. Abd pain, nausea have resolved.       Hypokalemia Replaced   Acute lower urinary tract infection Started on rocephin, transitioned to keflex on discharge.     Discharge Instructions  Discharge Instructions     Diet - low sodium heart healthy   Complete by: As directed    Discharge instructions   Complete by: As directed    Please follow up with PCP in one week. Please follow up with gastroenterology for the biopsy report.      Allergies as of 05/03/2021       Reactions   Pineapple Anaphylaxis   Penicillins Hives   As a baby. Per mother: rash   Penicillins Hives   Childhood allergic reaction   Pineapple Hives         Medication List     TAKE these medications    cephALEXin 500 MG capsule Commonly known as: KEFLEX Take 1 capsule (500 mg total) by mouth 2 (two) times daily for 5 days.   loperamide 2 MG tablet Commonly known as: Imodium A-D Take 1 tablet (2 mg total) by mouth 4 (four) times daily as needed for diarrhea or loose stools.   ondansetron 4 MG tablet Commonly known as: ZOFRAN Take 1 tablet (4 mg total) by mouth every 4 (four) hours as needed for nausea or vomiting.   sucralfate 1 g tablet Commonly known as: Carafate Take 1 tablet (1 g total) by mouth 4 (four) times daily -  with meals and at bedtime for 5 days.        Allergies  Allergen Reactions   Pineapple Anaphylaxis   Penicillins Hives    As a baby. Per mother: rash   Penicillins Hives    Childhood allergic reaction   Pineapple Hives    Consultations:  GI   Procedures/Studies: CT Abdomen Pelvis W Contrast  Result Date: 04/30/2021 CLINICAL DATA:  Acute lower abdominal pain, nausea, vomiting, and diarrhea since yesterday, seen for same thing 6 days ago EXAM: CT ABDOMEN AND PELVIS WITH CONTRAST TECHNIQUE: Multidetector CT imaging of the abdomen and pelvis was performed using the standard protocol following bolus administration of intravenous contrast. CONTRAST:  OMNIPAQUE IOHEXOL 300 MG/ML SOLN IV. Dilute oral contrast. COMPARISON:  08/12/2020 FINDINGS: Lower chest: Lung bases clear Hepatobiliary: Focal fatty infiltration  of liver adjacent to falciform fissure. Gallbladder and liver otherwise normal appearance. Pancreas: Normal appearance Spleen: Normal appearance Adrenals/Urinary Tract: Adrenal glands, kidneys, ureters, and bladder normal appearance Stomach/Bowel: Normal appendix. Stomach incompletely distended, unable to exclude distal wall thickening in this setting. Small bowel loops grossly unremarkable. Colon is unopacified in under distended, no gross abnormality seen. Vascular/Lymphatic: Vascular structures  patent.  No adenopathy. Reproductive: Unremarkable uterus and ovaries for age Other: No free air or free fluid. No hernia or definite inflammatory process. Musculoskeletal: Unremarkable IMPRESSION: No definite acute intra-abdominal or intrapelvic abnormalities. Electronically Signed   By: Ulyses Southward M.D.   On: 04/30/2021 12:52   DG Chest Port 1 View  Result Date: 04/30/2021 CLINICAL DATA:  Chest pain, vomiting EXAM: PORTABLE CHEST 1 VIEW COMPARISON:  08/12/2020 FINDINGS: The heart size and mediastinal contours are within normal limits. Both lungs are clear. The visualized skeletal structures are unremarkable. IMPRESSION: No active disease. Electronically Signed   By: Ernie Avena M.D.   On: 04/30/2021 13:26      Subjective: No nausea, vomiting or diarrhea today.   Discharge Exam: Vitals:   05/03/21 1340 05/03/21 1347  BP: 119/68 111/60  Pulse: 71 70  Resp: 19 20  Temp:    SpO2: 100% 100%   Vitals:   05/03/21 1321 05/03/21 1330 05/03/21 1340 05/03/21 1347  BP: (!) 108/45 119/68 119/68 111/60  Pulse: 84 82 71 70  Resp: 17 17 19 20   Temp: 97.7 F (36.5 C)     TempSrc: Oral     SpO2: 100% 100% 100% 100%  Weight:      Height:        General: Pt is alert, awake, not in acute distress Cardiovascular: RRR, S1/S2 +, no rubs, no gallops Respiratory: CTA bilaterally, no wheezing, no rhonchi Abdominal: Soft, NT, ND, bowel sounds + Extremities: no edema, no cyanosis    The results of significant diagnostics from this hospitalization (including imaging, microbiology, ancillary and laboratory) are listed below for reference.     Microbiology: Recent Results (from the past 240 hour(s))  Culture, blood (routine x 2)     Status: None   Collection Time: 04/30/21  2:29 PM   Specimen: BLOOD  Result Value Ref Range Status   Specimen Description   Final    BLOOD Blood Culture adequate volume Performed at [facility], [address] [place], [place]     Special Requests   Final    BOTTLES DRAWN AEROBIC AND ANAEROBIC LEFT ANTECUBITAL Performed at [facility], [address] [place], [place]    Culture   Final    NO GROWTH 5 DAYS Performed at [facility], [address] [place], [place]    Report Status 05/05/2021 FINAL  Final  Culture, blood (routine x 2)     Status: None   Collection Time: 04/30/21  2:47 PM   Specimen: BLOOD  Result Value Ref Range Status   Specimen Description   Final    BLOOD Blood Culture adequate volume Performed at [facility], [address] [place], [place]    Special Requests   Final    BOTTLES DRAWN AEROBIC AND ANAEROBIC RIGHT ANTECUBITAL Performed at [facility], [address] [place], [place]    Culture   Final    NO GROWTH 5 DAYS Performed at [facility], [address] [place], [place]  [address]    Report Status 05/05/2021 FINAL  Final  Resp Panel by RT-PCR (Flu A&B, Covid) Peripheral     Status: None   Collection Time: 04/30/21  2:49 PM   Specimen: Peripheral; Nasopharyngeal(NP) swabs in vial transport medium  Result Value Ref Range Status   SARS Coronavirus 2 by RT PCR NEGATIVE NEGATIVE Final    Comment: (NOTE) SARS-CoV-2 target nucleic acids are NOT DETECTED.  The SARS-CoV-2 RNA is generally detectable in upper respiratory specimens during the acute phase of infection. The lowest concentration of SARS-CoV-2 viral copies this assay can detect is 138 copies/mL. A negative result does not preclude SARS-Cov-2 infection and should not be used as the sole basis for treatment or other patient management decisions. A negative result may occur with  improper specimen collection/handling, submission of specimen other than nasopharyngeal swab, presence of viral mutation(s) within the areas targeted by this assay, and inadequate number of viral copies(<138 copies/mL). A negative result must be combined  with clinical observations, patient history, and epidemiological information. The expected result is Negative.  Fact Sheet for Patients:  BloggerCourse.com  Fact Sheet for Healthcare Providers:  SeriousBroker.it  This test is no t yet approved or cleared by the [place] FDA and  has been authorized for detection and/or diagnosis of SARS-CoV-2 by FDA under an Emergency Use Authorization (EUA). This EUA will remain  in effect (meaning this test can be used) for the duration of the COVID-19 declaration under Section 564(b)(1) of the Act, 21 U.S.C.section 360bbb-3(b)(1), unless the authorization is terminated  or revoked sooner.       Influenza A by PCR NEGATIVE NEGATIVE Final   Influenza B by PCR NEGATIVE NEGATIVE Final    Comment: (NOTE) The Xpert Xpress SARS-CoV-2/FLU/RSV plus assay is intended as an aid in the diagnosis of influenza from Nasopharyngeal swab specimens and should not be used as a sole basis for treatment. Nasal washings and aspirates are unacceptable for Xpert Xpress SARS-CoV-2/FLU/RSV testing.  Fact Sheet for Patients: BloggerCourse.com  Fact Sheet for Healthcare Providers: SeriousBroker.it  This test is not yet approved or cleared by the [place] FDA and has been authorized for detection and/or diagnosis of SARS-CoV-2 by FDA under an Emergency Use Authorization (EUA). This EUA will remain in effect (meaning this test can be used) for the duration of the COVID-19 declaration under Section 564(b)(1) of the Act, 21 U.S.C. section 360bbb-3(b)(1), unless the authorization is terminated or revoked.  Performed at [facility], [address] [place], [place] [address]   Gastrointestinal Panel by PCR , Stool     Status: None   Collection Time: 04/30/21 11:13 PM   Specimen: STOOL  Result Value Ref Range Status   Campylobacter species NOT  DETECTED NOT DETECTED Final   Plesimonas shigelloides NOT DETECTED NOT DETECTED Final   Salmonella species NOT DETECTED NOT DETECTED Final   Yersinia enterocolitica NOT DETECTED NOT DETECTED Final   Vibrio species NOT DETECTED NOT DETECTED Final   Vibrio cholerae NOT DETECTED NOT DETECTED Final   Enteroaggregative E coli (EAEC) NOT DETECTED NOT DETECTED Final   Enteropathogenic E coli (EPEC) NOT DETECTED NOT DETECTED Final   Enterotoxigenic E coli (ETEC) NOT DETECTED NOT DETECTED Final   Shiga like toxin producing E coli (STEC) NOT DETECTED NOT DETECTED Final   Shigella/Enteroinvasive E coli (EIEC) NOT DETECTED NOT DETECTED Final   Cryptosporidium NOT DETECTED NOT DETECTED Final   Cyclospora cayetanensis NOT DETECTED NOT DETECTED Final   Entamoeba histolytica NOT  DETECTED NOT DETECTED Final   Giardia lamblia NOT DETECTED NOT DETECTED Final   Adenovirus F40/41 NOT DETECTED NOT DETECTED Final   Astrovirus NOT DETECTED NOT DETECTED Final   Norovirus GI/GII NOT DETECTED NOT DETECTED Final   Rotavirus A NOT DETECTED NOT DETECTED Final   Sapovirus (I, II, IV, and V) NOT DETECTED NOT DETECTED Final    Comment: Performed at [facility], [address] [place], [place] [address]  C Difficile Quick Screen w PCR reflex     Status: None   Collection Time: 04/30/21 11:13 PM  Result Value Ref Range Status   C Diff antigen NEGATIVE NEGATIVE Final   C Diff toxin NEGATIVE NEGATIVE Final   C Diff interpretation NEGATIVE  Final    Comment: Performed at [facility], [address], [place], [place] [address]  Calprotectin, Fecal     Status: None   Collection Time: 05/01/21 12:05 PM   Specimen: STOOL  Result Value Ref Range Status   Calprotectin, Fecal <16 0 - 120 ug/g Final    Comment: (NOTE) Concentration     Interpretation   Follow-Up <16 - 50 ug/g     Normal           None >50 -120 ug/g     Borderline       Re-evaluate in 4-6 weeks    >120 ug/g     Abnormal          Repeat as clinically                                   indicated Performed At: [facility] [address] [place], [place] [id number] Jolene Schimke MD WG:[id number]      Labs: BNP (last 3 results) No results for input(s): BNP in the last 8760 hours. Basic Metabolic Panel: Recent Labs  Lab 04/30/21 1040 05/01/21 0505 05/02/21 0506 05/03/21 0508  NA 134* 137 136 139  K 2.8* 3.5 3.3* 3.4*  CL 101 106 108 107  CO2 23 22 19* 22  GLUCOSE 103* 79 81 95  BUN 10 10 9  <5*  CREATININE 0.66 0.82 0.63 0.62  CALCIUM 9.0 9.0 8.6* 8.6*  MG  --  2.3 2.1 2.0   Liver Function Tests: Recent Labs  Lab 04/30/21 1040  AST 13*  ALT 11  ALKPHOS 51  BILITOT 1.9*  PROT 6.9  ALBUMIN 4.0   Recent Labs  Lab 04/30/21 1040  LIPASE 23   No results for input(s): AMMONIA in the last 168 hours. CBC: Recent Labs  Lab 04/30/21 1040 04/30/21 1330 05/01/21 0505 05/02/21 0506 05/03/21 0508  WBC 27.5* 29.1* 20.3* 15.7* 9.9  NEUTROABS 25.0* 25.5* 17.5* 12.9* 6.9  HGB 13.0 12.0 12.7 11.6* 10.8*  HCT 37.5 34.8* 37.4 33.4* 31.4*  MCV 91.7 92.8 94.7 92.3 92.4  PLT 203 195 195 205 223   Cardiac Enzymes: No results for input(s): CKTOTAL, CKMB, CKMBINDEX, TROPONINI in the last 168 hours. BNP: Invalid input(s): POCBNP CBG: No results for input(s): GLUCAP in the last 168 hours. D-Dimer No results for input(s): DDIMER in the last 72 hours. Hgb A1c No results for input(s): HGBA1C in the last 72 hours. Lipid Profile No results for input(s): CHOL, HDL, LDLCALC, TRIG, CHOLHDL, LDLDIRECT in the last 72 hours. Thyroid function studies No results for input(s): TSH, T4TOTAL, T3FREE, THYROIDAB in the last 72 hours.  Invalid input(s): FREET3 Anemia work up  No results for input(s): VITAMINB12, FOLATE, FERRITIN, TIBC, IRON, RETICCTPCT in the last 72 hours. Urinalysis    Component Value Date/Time   COLORURINE AMBER (A) 04/30/2021 0950   APPEARANCEUR HAZY (A) 04/30/2021 0950   LABSPEC  >=1.030 04/30/2021 0950   PHURINE 6.0 04/30/2021 0950   GLUCOSEU NEGATIVE 04/30/2021 0950   HGBUR MODERATE (A) 04/30/2021 0950   BILIRUBINUR NEGATIVE 04/30/2021 0950   BILIRUBINUR negative 10/28/2017 0948   KETONESUR 80 (A) 04/30/2021 0950   PROTEINUR NEGATIVE 04/30/2021 0950   UROBILINOGEN 0.2 03/14/2021 1237   NITRITE NEGATIVE 04/30/2021 0950   LEUKOCYTESUR SMALL (A) 04/30/2021 0950   Sepsis Labs Invalid input(s): PROCALCITONIN,  WBC,  LACTICIDVEN Microbiology Recent Results (from the past 240 hour(s))  Culture, blood (routine x 2)     Status: None   Collection Time: 04/30/21  2:29 PM   Specimen: BLOOD  Result Value Ref Range Status   Specimen Description   Final    BLOOD Blood Culture adequate volume Performed at [facility] [place], [address] [place], [place] [address]    Special Requests   Final    BOTTLES DRAWN AEROBIC AND ANAEROBIC LEFT ANTECUBITAL Performed at [facility] [place], [address] [place], [place] [address]    Culture   Final    NO GROWTH 5 DAYS Performed at [facility], [address] [place], [place] [address]    Report Status 05/05/2021 FINAL  Final  Culture, blood (routine x 2)     Status: None   Collection Time: 04/30/21  2:47 PM   Specimen: BLOOD  Result Value Ref Range Status   Specimen Description   Final    BLOOD Blood Culture adequate volume Performed at [facility] [place], [address] [place], [place] [address]    Special Requests   Final    BOTTLES DRAWN AEROBIC AND ANAEROBIC RIGHT ANTECUBITAL Performed at [facility] [place], [address] [place], [place] [address]    Culture   Final    NO GROWTH 5 DAYS Performed at [facility], [address] [place], [place] [address]    Report Status 05/05/2021 FINAL  Final  Resp Panel by RT-PCR (Flu A&B, Covid) Peripheral     Status: None   Collection Time: 04/30/21  2:49 PM   Specimen: Peripheral; Nasopharyngeal(NP) swabs in vial transport  medium  Result Value Ref Range Status   SARS Coronavirus 2 by RT PCR NEGATIVE NEGATIVE Final    Comment: (NOTE) SARS-CoV-2 target nucleic acids are NOT DETECTED.  The SARS-CoV-2 RNA is generally detectable in upper respiratory specimens during the acute phase of infection. The lowest concentration of SARS-CoV-2 viral copies this assay can detect is 138 copies/mL. A negative result does not preclude SARS-Cov-2 infection and should not be used as the sole basis for treatment or other patient management decisions. A negative result may occur with  improper specimen collection/handling, submission of specimen other than nasopharyngeal swab, presence of viral mutation(s) within the areas targeted by this assay, and inadequate number of viral copies(<138 copies/mL). A negative result must be combined with clinical observations, patient history, and epidemiological information. The expected result is Negative.  Fact Sheet for Patients:  BloggerCourse.comhttps://www.fda.gov/media/152166/download  Fact Sheet for Healthcare Providers:  SeriousBroker.ithttps://www.fda.gov/media/152162/download  This test is no t yet approved or cleared by the [place] FDA and  has been authorized for detection and/or diagnosis of SARS-CoV-2 by FDA under an Emergency Use Authorization (EUA).  This EUA will remain  in effect (meaning this test can be used) for the duration of the COVID-19 declaration under Section 564(b)(1) of the Act, 21 U.S.C.section 360bbb-3(b)(1), unless the authorization is terminated  or revoked sooner.       Influenza A by PCR NEGATIVE NEGATIVE Final   Influenza B by PCR NEGATIVE NEGATIVE Final    Comment: (NOTE) The Xpert Xpress SARS-CoV-2/FLU/RSV plus assay is intended as an aid in the diagnosis of influenza from Nasopharyngeal swab specimens and should not be used as a sole basis for treatment. Nasal washings and aspirates are unacceptable for Xpert Xpress SARS-CoV-2/FLU/RSV testing.  Fact Sheet for  Patients: BloggerCourse.com  Fact Sheet for Healthcare Providers: SeriousBroker.it  This test is not yet approved or cleared by the [place] FDA and has been authorized for detection and/or diagnosis of SARS-CoV-2 by FDA under an Emergency Use Authorization (EUA). This EUA will remain in effect (meaning this test can be used) for the duration of the COVID-19 declaration under Section 564(b)(1) of the Act, 21 U.S.C. section 360bbb-3(b)(1), unless the authorization is terminated or revoked.  Performed at [facility], [address] [place], [place] [address]   Gastrointestinal Panel by PCR , Stool     Status: None   Collection Time: 04/30/21 11:13 PM   Specimen: STOOL  Result Value Ref Range Status   Campylobacter species NOT DETECTED NOT DETECTED Final   Plesimonas shigelloides NOT DETECTED NOT DETECTED Final   Salmonella species NOT DETECTED NOT DETECTED Final   Yersinia enterocolitica NOT DETECTED NOT DETECTED Final   Vibrio species NOT DETECTED NOT DETECTED Final   Vibrio cholerae NOT DETECTED NOT DETECTED Final   Enteroaggregative E coli (EAEC) NOT DETECTED NOT DETECTED Final   Enteropathogenic E coli (EPEC) NOT DETECTED NOT DETECTED Final   Enterotoxigenic E coli (ETEC) NOT DETECTED NOT DETECTED Final   Shiga like toxin producing E coli (STEC) NOT DETECTED NOT DETECTED Final   Shigella/Enteroinvasive E coli (EIEC) NOT DETECTED NOT DETECTED Final   Cryptosporidium NOT DETECTED NOT DETECTED Final   Cyclospora cayetanensis NOT DETECTED NOT DETECTED Final   Entamoeba histolytica NOT DETECTED NOT DETECTED Final   Giardia lamblia NOT DETECTED NOT DETECTED Final   Adenovirus F40/41 NOT DETECTED NOT DETECTED Final   Astrovirus NOT DETECTED NOT DETECTED Final   Norovirus GI/GII NOT DETECTED NOT DETECTED Final   Rotavirus A NOT DETECTED NOT DETECTED Final   Sapovirus (I, II, IV, and V) NOT DETECTED NOT DETECTED Final     Comment: Performed at [facility], [address] [place], [place] [address]  C Difficile Quick Screen w PCR reflex     Status: None   Collection Time: 04/30/21 11:13 PM  Result Value Ref Range Status   C Diff antigen NEGATIVE NEGATIVE Final   C Diff toxin NEGATIVE NEGATIVE Final   C Diff interpretation NEGATIVE  Final    Comment: Performed at [facility], [address] [place], [place] [address]  Calprotectin, Fecal     Status: None   Collection Time: 05/01/21 12:05 PM   Specimen: STOOL  Result Value Ref Range Status   Calprotectin, Fecal <16 0 - 120 ug/g Final    Comment: (NOTE) Concentration     Interpretation   Follow-Up <16 - 50 ug/g     Normal           None >50 -120 ug/g     Borderline       Re-evaluate  in 4-6 weeks    >120 ug/g     Abnormal         Repeat as clinically                                   indicated Performed At: [facility] [address] [place], [place] [id number] Jolene SchimkeNagendra Sanjai MD JX:[id number]:[phone]      Time coordinating discharge: 38 minutes.   SIGNED:   Kathlen ModyVijaya Maitri Schnoebelen, MD  [facility]    If 7PM-7AM, please contact night-coverage [email or web address] Password [email or web address]

## 2021-06-03 ENCOUNTER — Encounter (HOSPITAL_COMMUNITY): Payer: Self-pay

## 2021-06-03 ENCOUNTER — Emergency Department (HOSPITAL_COMMUNITY)
Admission: EM | Admit: 2021-06-03 | Discharge: 2021-06-03 | Disposition: A | Discharge status: Home / Self Care | Payer: Medicaid Other | Attending: Emergency Medicine | Admitting: Emergency Medicine

## 2021-06-03 ENCOUNTER — Other Ambulatory Visit: Payer: Self-pay

## 2021-06-03 DIAGNOSIS — S058X1A Other injuries of right eye and orbit, initial encounter: Secondary | ICD-10-CM | POA: Insufficient documentation

## 2021-06-03 DIAGNOSIS — X58XXXA Exposure to other specified factors, initial encounter: Secondary | ICD-10-CM | POA: Diagnosis not present

## 2021-06-03 DIAGNOSIS — S0501XA Injury of conjunctiva and corneal abrasion without foreign body, right eye, initial encounter: Secondary | ICD-10-CM | POA: Diagnosis present

## 2021-06-03 MED ORDER — FLUORESCEIN SODIUM 1 MG OP STRP
1.0000 | ORAL_STRIP | Freq: Once | OPHTHALMIC | Status: AC
  Administered 2021-06-03: 1 via OPHTHALMIC
  Filled 2021-06-03: qty 1

## 2021-06-03 MED ORDER — ERYTHROMYCIN 5 MG/GM OP OINT: TOPICAL_OINTMENT | OPHTHALMIC | 0 refills | Status: AC

## 2021-06-03 MED ORDER — TETRACAINE HCL 0.5 % OP SOLN
2.0000 [drp] | Freq: Once | OPHTHALMIC | Status: AC
  Administered 2021-06-03: 2 [drp] via OPHTHALMIC
  Filled 2021-06-03: qty 4

## 2021-06-03 NOTE — ED Provider Notes (Signed)
[facility]-EMERGENCY DEPT Provider Note   CSN: [id number] Arrival date & time: 06/03/21  1109     History  Chief Complaint  Patient presents with   Eye Problem    Patricia [name] is a 21 y.o. female This 21 year old female who reports that she woke up this morning with irritation of the right eye secondary to fake lashes.  She reports that she was trying to pull off the eyelash now has sensation of foreign body in the inner side of the eye.  Patient reports some blurry vision, and pain.  Patient reports sensitivity to light.  Patient denies history of same.   Eye Problem     Home Medications Prior to Admission medications   Medication Sig Start Date End Date Taking? Authorizing Provider  erythromycin ophthalmic ointment Place a 1/2 inch ribbon of ointment into the right lower eyelid 4 times daily for the next 5 days 06/03/21  Yes Shenna Brissette H, PA-C  loperamide (IMODIUM A-D) 2 MG tablet Take 1 tablet (2 mg total) by mouth 4 (four) times daily as needed for diarrhea or loose stools. 05/03/21   Kathlen Mody, MD  ondansetron (ZOFRAN) 4 MG tablet Take 1 tablet (4 mg total) by mouth every 4 (four) hours as needed for nausea or vomiting. 04/24/21   Tanda Rockers A, DO  sucralfate (CARAFATE) 1 g tablet Take 1 tablet (1 g total) by mouth 4 (four) times daily -  with meals and at bedtime for 5 days. 04/24/21 04/29/21  Sloan Leiter, DO      Allergies    Pineapple, Penicillins, Penicillins, and Pineapple    Review of Systems   Review of Systems  Eyes:  Positive for pain and visual disturbance.  All other systems reviewed and are negative.  Physical Exam Updated Vital Signs BP 125/80    Pulse 72    Temp 97.6 F (36.4 C)    Resp 14    Ht 5\' 4"  (1.626 m)    Wt 52.2 kg    SpO2 100%    BMI 19.74 kg/m  Physical Exam Vitals and nursing note reviewed.  Constitutional:      General: She is not in acute distress.    Appearance: Normal appearance.  HENT:      Head: Normocephalic and atraumatic.  Eyes:     General:        Right eye: No discharge.        Left eye: No discharge.     Comments: Right eye minimally red, irritated.  Fluorescein exam with Woods lamp reveals 1 mm linear abrasion just overlying pupil.  Negative Seidel sign.  Intact EOMs.  Pupils reactive.  IOP 18 mmHg.  Cardiovascular:     Rate and Rhythm: Normal rate and regular rhythm.  Pulmonary:     Effort: Pulmonary effort is normal. No respiratory distress.  Musculoskeletal:        General: No deformity.  Skin:    General: Skin is warm and dry.  Neurological:     Mental Status: She is alert and oriented to person, place, and time.  Psychiatric:        Mood and Affect: Mood normal.        Behavior: Behavior normal.    ED Results / Procedures / Treatments   Labs (all labs ordered are listed, but only abnormal results are displayed) Labs Reviewed - No data to display  EKG None  Radiology No results found.  Procedures Procedures  Medications Ordered in ED Medications  fluorescein ophthalmic strip 1 strip (1 strip Right Eye Given by Other 06/03/21 1254)  tetracaine (PONTOCAINE) 0.5 % ophthalmic solution 2 drop (2 drops Right Eye Given by Other 06/03/21 1254)    ED Course/ Medical Decision Making/ A&P                            Medical Decision Making Risk Prescription drug management.   History obtained from patient. No significant past medical history. This is a 21 year old female who awoke with right eye pain secondary to irritation from fake eyelashes.  She has slightly decreased vision on visual acuity, as expected with irritation.  She has intact EOMs bilaterally.  She has minimal redness and irritation.  She has no signs of infection, and the insult to the right eye happened just prior to arrival.  Examination of the eye reveals small linear corneal laceration just overlying the pupil.  Normal intraocular pressure.  Patient with normal movement of the  eyes, no evidence of globe rupture, intact EOMs.  We will prescribe Romycin topical antibiotic ointment, and encourage close follow-up with ophthalmology.  Patient understands agrees to plan, discharged in stable condition at this time.  Final Clinical Impression(s) / ED Diagnoses Final diagnoses:  Abrasion of right cornea, initial encounter    Rx / DC Orders ED Discharge Orders          Ordered    erythromycin ophthalmic ointment        06/03/21 1244              Olene Floss, PA-C 06/03/21 1340    [place], MD 06/06/21 [phone]

## 2021-06-03 NOTE — ED Triage Notes (Signed)
Pt reports eye pain and swelling that began this morning. She reports having false eyelash extensions on recently.

## 2021-06-03 NOTE — Discharge Instructions (Addendum)
Please use Tylenol or ibuprofen for pain.  You may use 600 mg ibuprofen every 6 hours or 1000 mg of Tylenol every 6 hours.  You may choose to alternate between the 2.  This would be most effective.  Not to exceed 4 g of Tylenol within 24 hours.  Not to exceed 3200 mg ibuprofen 24 hours.  As we discussed you do have a small abrasion across your cornea, it will heal on its own over the next few days, please apply the topical antibiotic ointment 4 times a day for the next 5 days.  Please follow-up with your eye doctor.  Please return to the emergency department if you have worsening vision, pus draining from your eye.

## 2021-06-05 ENCOUNTER — Telehealth: Payer: Self-pay

## 2021-06-05 NOTE — Telephone Encounter (Signed)
Transition Care Management Unsuccessful Follow-up Telephone Call  Date of discharge and from where:  06/03/2021-[place]   Attempts:  1st Attempt  Reason for unsuccessful TCM follow-up call:  Unable to reach patient

## 2021-06-06 NOTE — Telephone Encounter (Signed)
Transition Care Management Unsuccessful Follow-up Telephone Call  Date of discharge and from where:  06/03/2021-[facility]   Attempts:  2nd Attempt  Reason for unsuccessful TCM follow-up call:  Unable to reach patient

## 2021-06-07 NOTE — Telephone Encounter (Signed)
Transition Care Management Unsuccessful Follow-up Telephone Call  Date of discharge and from where:  06/03/2021-[facility]   Attempts:  3rd Attempt  Reason for unsuccessful TCM follow-up call:  Unable to reach patient

## 2022-03-07 IMAGING — CT CT ABD-PELV W/ CM
2 of 4 series · 16 of 46 positions shown, 18 images · IV contrast (Omnipaque)
Comparison: 08/12/2020

CLINICAL DATA: Acute lower abdominal pain, nausea, vomiting, and
diarrhea since yesterday, seen for same thing 6 days ago

EXAM:
CT ABDOMEN AND PELVIS WITH CONTRAST
TECHNIQUE: Multidetector CT imaging of the abdomen and pelvis was performed
using the standard protocol following bolus administration of
intravenous contrast.
CONTRAST:  100mL OMNIPAQUE IOHEXOL 300 MG/ML SOLN IV. Dilute oral
contrast.

[Series 2: axial st · axial · 0.64mm/px · z∈[-354,+6]mm · 13 of 81 slices shown, 15 images]
[im 6/81  soft-tissue]
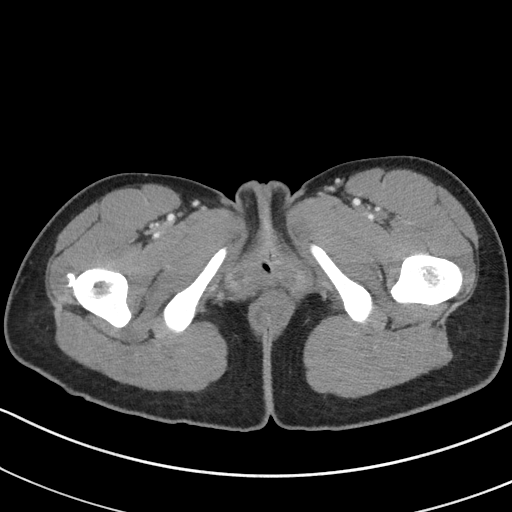
[im 6/81  bone]
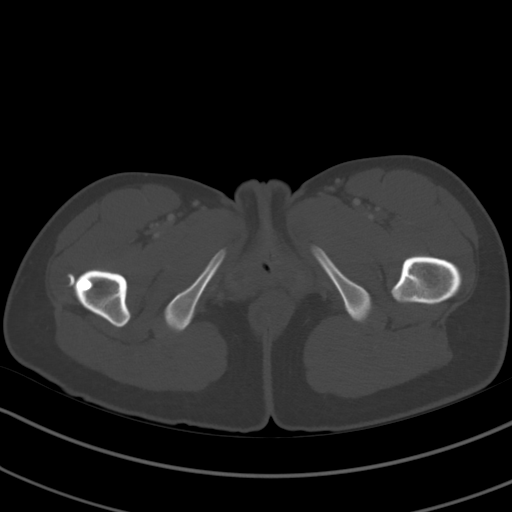
[im 12/81  soft-tissue]
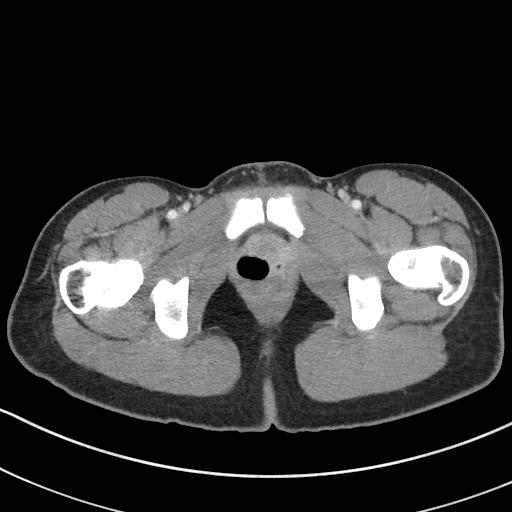
[im 18/81  soft-tissue]
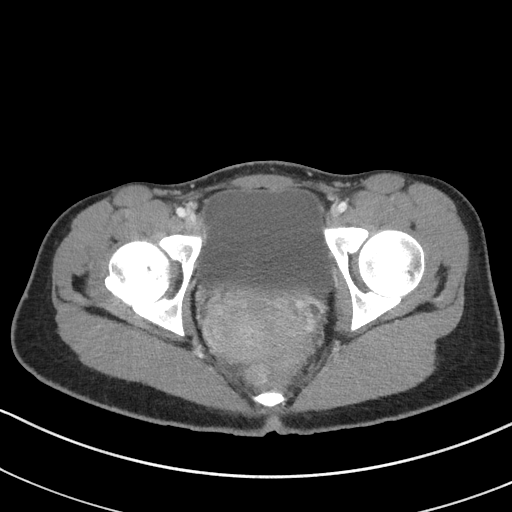
[im 24/81  soft-tissue]
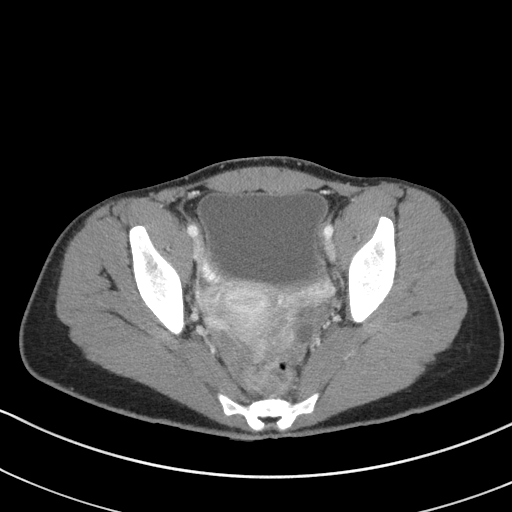
[im 30/81  soft-tissue]
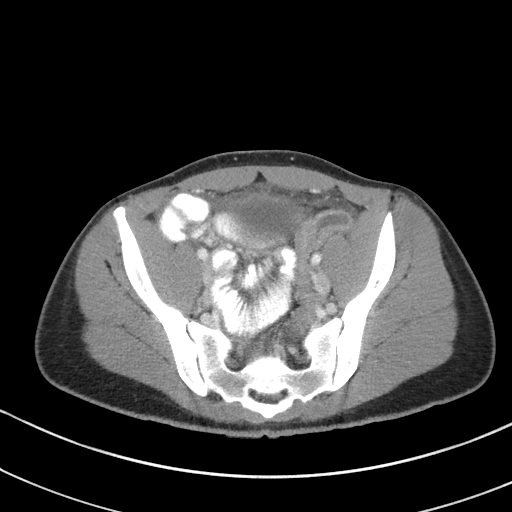
[im 36/81  soft-tissue]
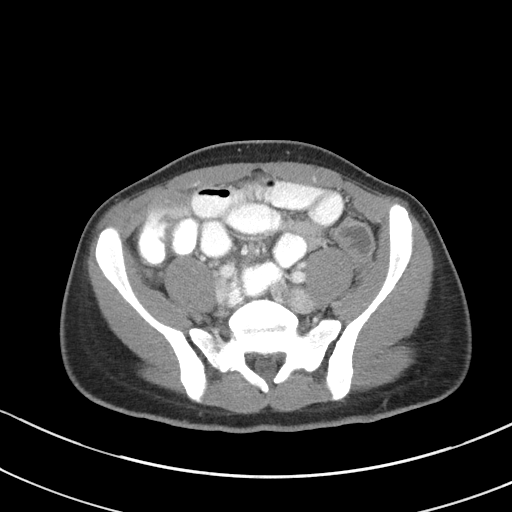
[im 42/81  soft-tissue]
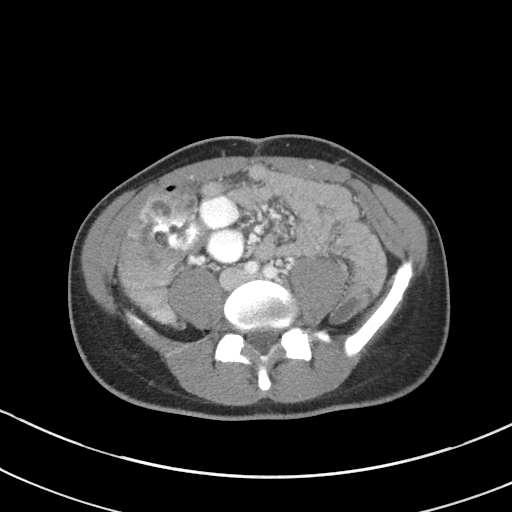
[im 48/81  soft-tissue]
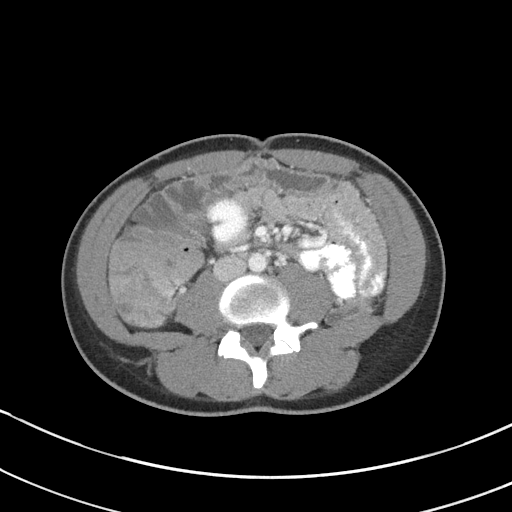
[im 54/81  soft-tissue]
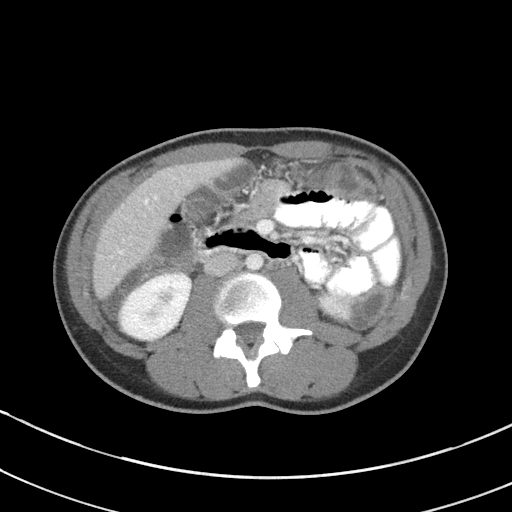
[im 54/81  bone]
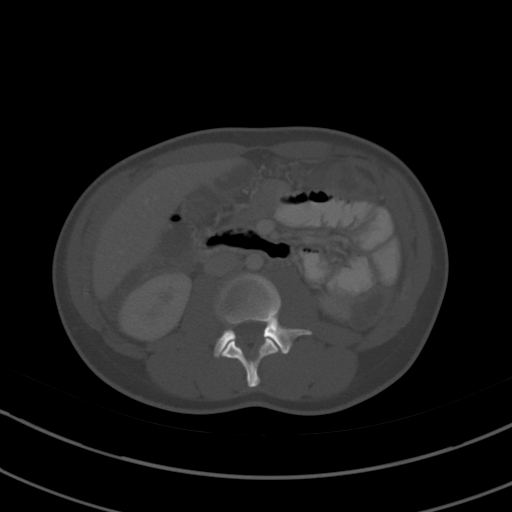
[im 60/81  soft-tissue]
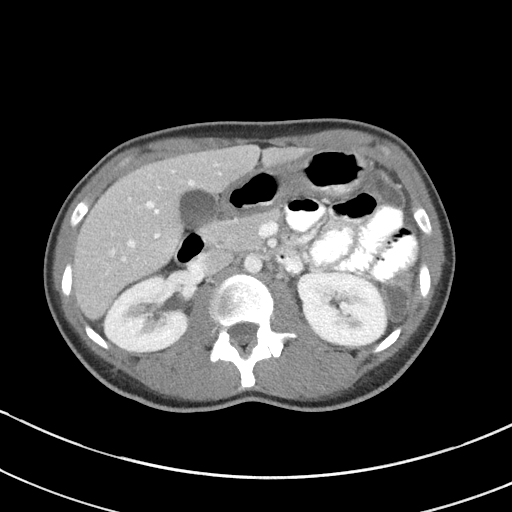
[im 66/81  soft-tissue]
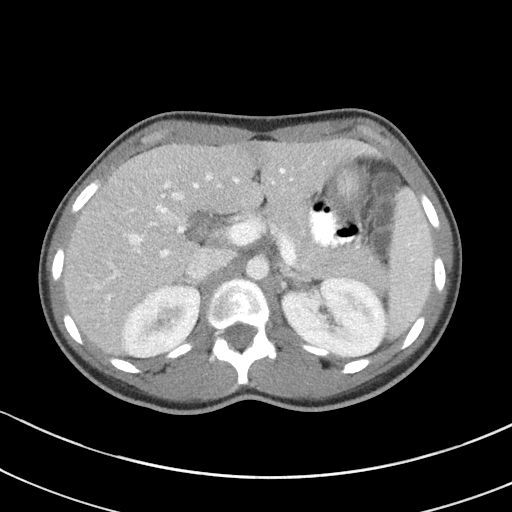
[im 72/81  soft-tissue]
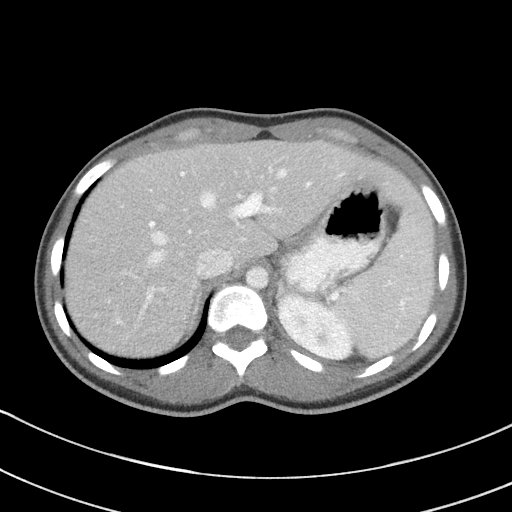
[im 78/81  soft-tissue]
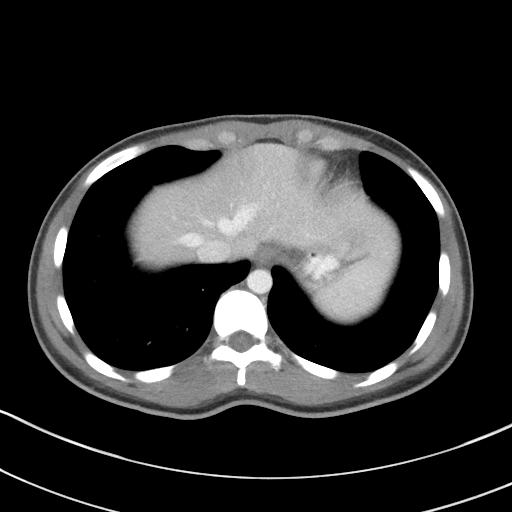

[Series 5: coronal st · coronal · 0.63mm/px · 3 of 72 slices shown]
[im 24/72  soft-tissue]
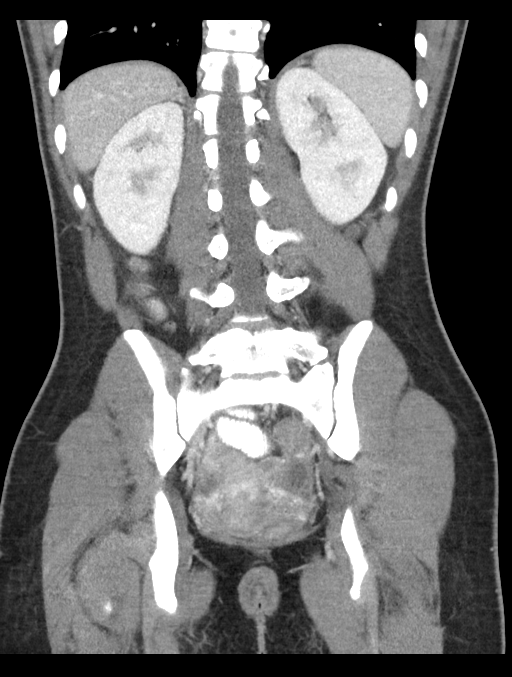
[im 32/72  soft-tissue]
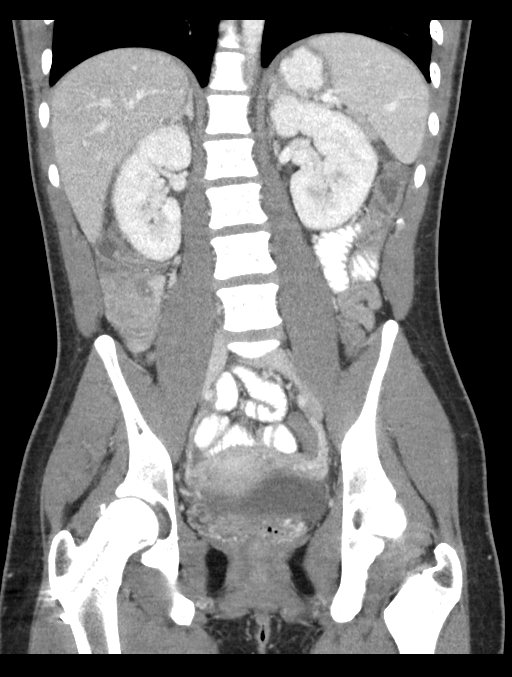
[im 40/72  soft-tissue]
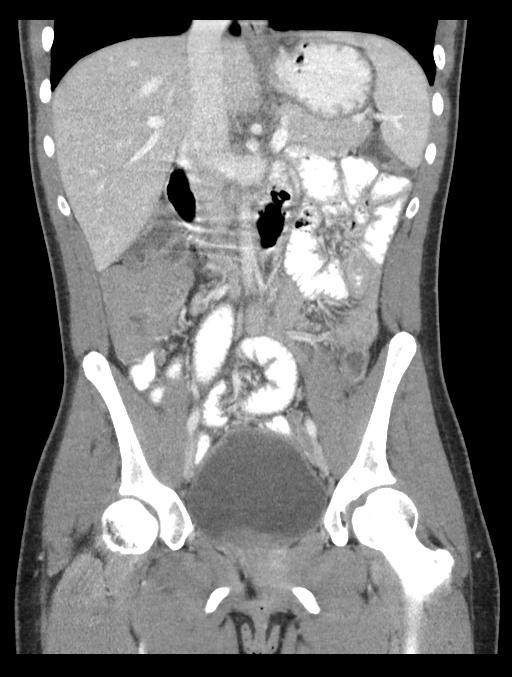

[16 of 46 positions shown; findings below may reference images not displayed]

FINDINGS: Lower chest: Lung bases clear

Hepatobiliary: Focal fatty infiltration of liver adjacent to
falciform fissure. Gallbladder and liver otherwise normal
appearance.

Pancreas: Normal appearance

Spleen: Normal appearance

Adrenals/Urinary Tract: Adrenal glands, kidneys, ureters, and
bladder normal appearance

Stomach/Bowel: Normal appendix. Stomach incompletely distended,
unable to exclude distal wall thickening in this setting. Small
bowel loops grossly unremarkable. Colon is unopacified in under
distended, no gross abnormality seen.

Vascular/Lymphatic: Vascular structures patent.  No adenopathy.

Reproductive: Unremarkable uterus and ovaries for age

Other: No free air or free fluid. No hernia or definite inflammatory
process.

Musculoskeletal: Unremarkable
IMPRESSION: No definite acute intra-abdominal or intrapelvic abnormalities.

## 2022-03-07 IMAGING — DX DG CHEST 1V PORT
1 series · 1 of 1 positions shown · non-contrast
Comparison: 08/12/2020

CLINICAL DATA: Chest pain, vomiting

EXAM:
PORTABLE CHEST 1 VIEW

[chest ap]
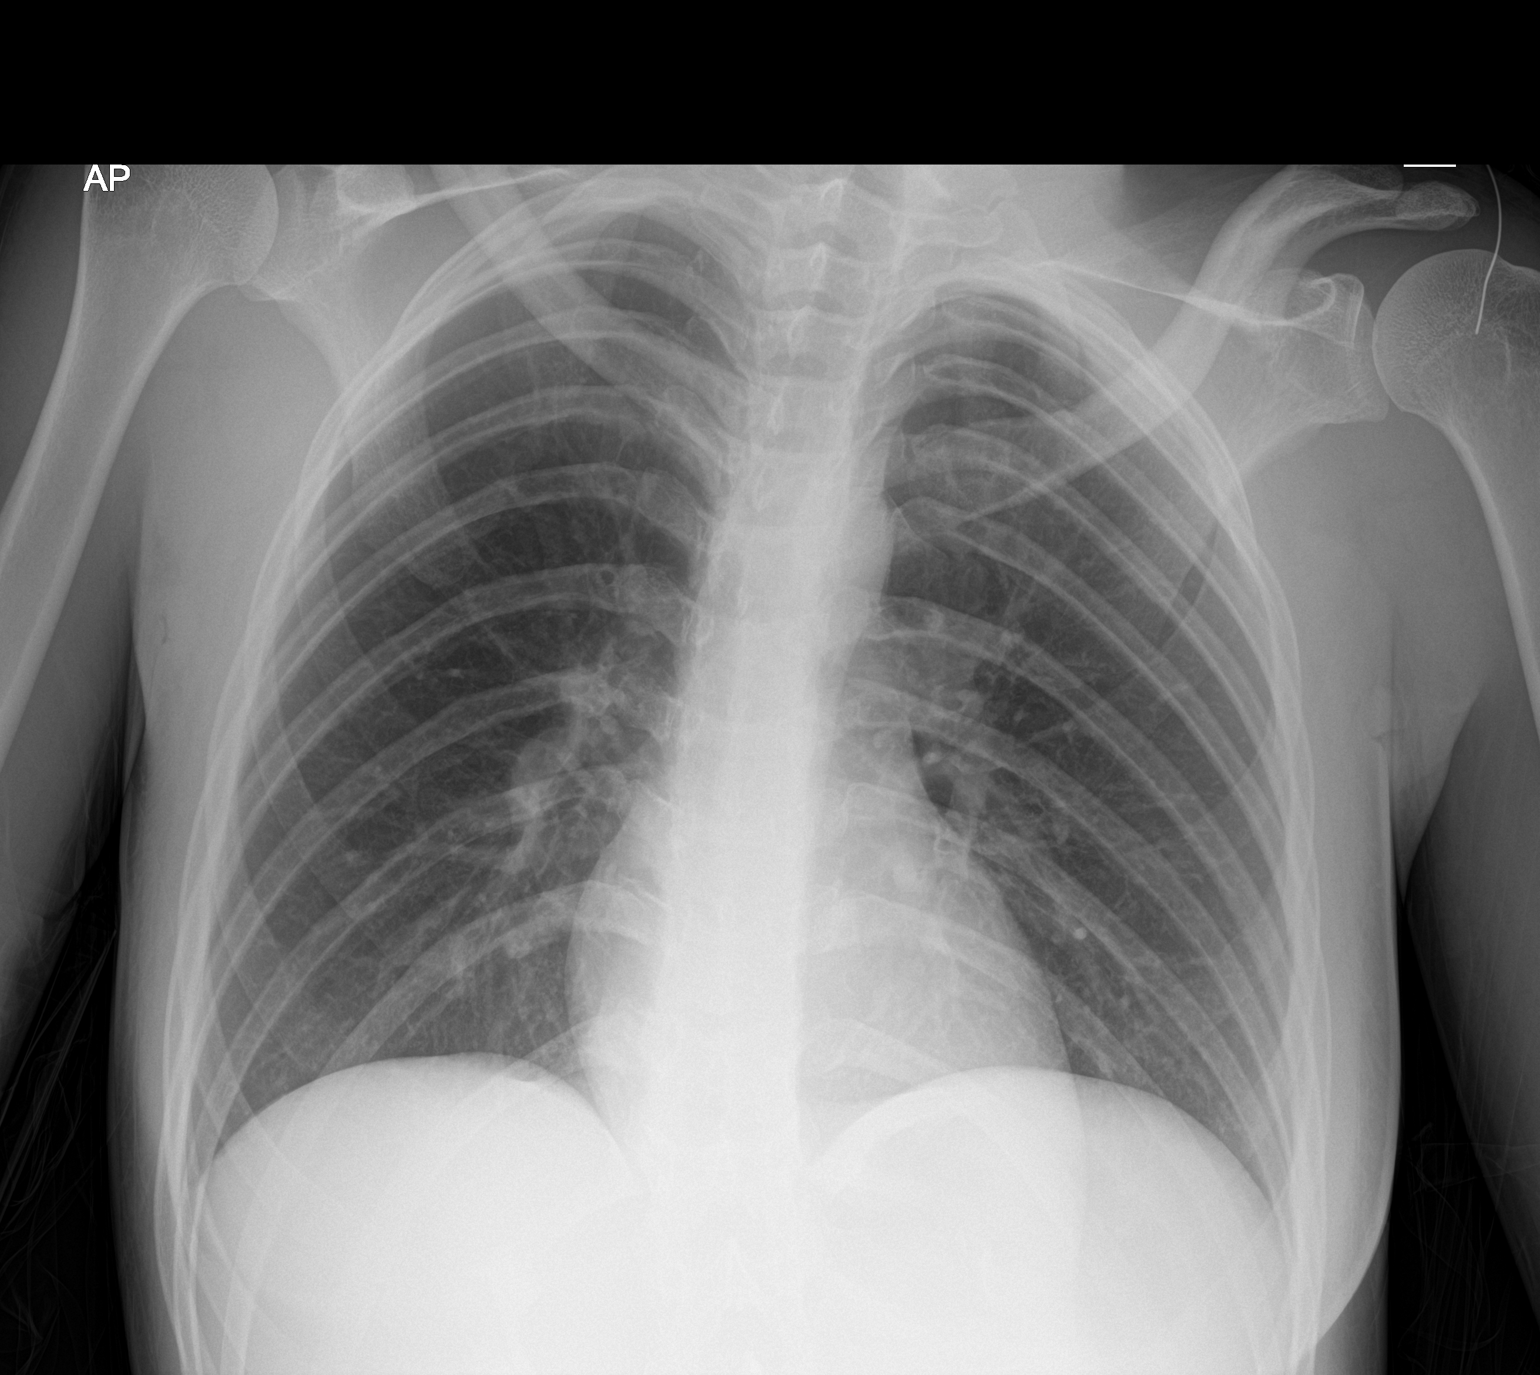

[1 of 1 positions shown; findings below may reference images not displayed]

FINDINGS: The heart size and mediastinal contours are within normal limits.
Both lungs are clear. The visualized skeletal structures are
unremarkable.
IMPRESSION: No active disease.
